# Patient Record
Sex: Female | Born: 1970 | Race: White | Hispanic: No | Marital: Single | Smoking: Current every day smoker
Health system: Southern US, Community
[De-identification: ages and names within clinical notes are randomized; demographics above are authoritative.]

## PROBLEM LIST (undated history)

## (undated) DIAGNOSIS — F909 Attention-deficit hyperactivity disorder, unspecified type: Secondary | ICD-10-CM

## (undated) DIAGNOSIS — F1910 Other psychoactive substance abuse, uncomplicated: Secondary | ICD-10-CM

## (undated) DIAGNOSIS — F329 Major depressive disorder, single episode, unspecified: Secondary | ICD-10-CM

## (undated) DIAGNOSIS — J449 Chronic obstructive pulmonary disease, unspecified: Secondary | ICD-10-CM

## (undated) DIAGNOSIS — F419 Anxiety disorder, unspecified: Secondary | ICD-10-CM

## (undated) DIAGNOSIS — J45909 Unspecified asthma, uncomplicated: Secondary | ICD-10-CM

## (undated) DIAGNOSIS — G629 Polyneuropathy, unspecified: Secondary | ICD-10-CM

## (undated) DIAGNOSIS — I2510 Atherosclerotic heart disease of native coronary artery without angina pectoris: Secondary | ICD-10-CM

## (undated) DIAGNOSIS — F319 Bipolar disorder, unspecified: Secondary | ICD-10-CM

## (undated) DIAGNOSIS — K219 Gastro-esophageal reflux disease without esophagitis: Secondary | ICD-10-CM

## (undated) DIAGNOSIS — F32A Depression, unspecified: Secondary | ICD-10-CM

## (undated) DIAGNOSIS — J189 Pneumonia, unspecified organism: Secondary | ICD-10-CM

## (undated) DIAGNOSIS — R569 Unspecified convulsions: Secondary | ICD-10-CM

## (undated) DIAGNOSIS — M1990 Unspecified osteoarthritis, unspecified site: Secondary | ICD-10-CM

## (undated) DIAGNOSIS — D649 Anemia, unspecified: Secondary | ICD-10-CM

## (undated) DIAGNOSIS — R519 Headache, unspecified: Secondary | ICD-10-CM

## (undated) DIAGNOSIS — N83209 Unspecified ovarian cyst, unspecified side: Secondary | ICD-10-CM

## (undated) DIAGNOSIS — G4730 Sleep apnea, unspecified: Secondary | ICD-10-CM

## (undated) DIAGNOSIS — I82409 Acute embolism and thrombosis of unspecified deep veins of unspecified lower extremity: Secondary | ICD-10-CM

## (undated) DIAGNOSIS — B1920 Unspecified viral hepatitis C without hepatic coma: Secondary | ICD-10-CM

## (undated) HISTORY — PX: WISDOM TOOTH EXTRACTION: SHX21

## (undated) HISTORY — PX: TUBAL LIGATION: SHX77

## (undated) HISTORY — PX: CHOLECYSTECTOMY: SHX55

## (undated) HISTORY — PX: TONSILLECTOMY: SUR1361

## (undated) HISTORY — PX: BACK SURGERY: SHX140

---

## 2015-10-26 ENCOUNTER — Encounter: Payer: Self-pay | Admitting: Emergency Medicine

## 2015-10-26 ENCOUNTER — Emergency Department: Payer: Medicaid Other

## 2015-10-26 ENCOUNTER — Emergency Department
Admission: EM | Admit: 2015-10-26 | Discharge: 2015-10-26 | Disposition: A | Discharge status: Home / Self Care | Payer: Medicaid Other | Attending: Emergency Medicine | Admitting: Emergency Medicine

## 2015-10-26 DIAGNOSIS — Z79899 Other long term (current) drug therapy: Secondary | ICD-10-CM | POA: Insufficient documentation

## 2015-10-26 DIAGNOSIS — S8261XA Displaced fracture of lateral malleolus of right fibula, initial encounter for closed fracture: Secondary | ICD-10-CM | POA: Insufficient documentation

## 2015-10-26 DIAGNOSIS — F329 Major depressive disorder, single episode, unspecified: Secondary | ICD-10-CM | POA: Insufficient documentation

## 2015-10-26 DIAGNOSIS — S99912A Unspecified injury of left ankle, initial encounter: Secondary | ICD-10-CM | POA: Diagnosis present

## 2015-10-26 DIAGNOSIS — X501XXA Overexertion from prolonged static or awkward postures, initial encounter: Secondary | ICD-10-CM | POA: Diagnosis not present

## 2015-10-26 DIAGNOSIS — S82891A Other fracture of right lower leg, initial encounter for closed fracture: Secondary | ICD-10-CM

## 2015-10-26 DIAGNOSIS — Y998 Other external cause status: Secondary | ICD-10-CM | POA: Insufficient documentation

## 2015-10-26 DIAGNOSIS — S82831A Other fracture of upper and lower end of right fibula, initial encounter for closed fracture: Secondary | ICD-10-CM | POA: Diagnosis not present

## 2015-10-26 DIAGNOSIS — Y9389 Activity, other specified: Secondary | ICD-10-CM | POA: Insufficient documentation

## 2015-10-26 DIAGNOSIS — Z720 Tobacco use: Secondary | ICD-10-CM | POA: Insufficient documentation

## 2015-10-26 DIAGNOSIS — Y9289 Other specified places as the place of occurrence of the external cause: Secondary | ICD-10-CM | POA: Diagnosis not present

## 2015-10-26 DIAGNOSIS — F419 Anxiety disorder, unspecified: Secondary | ICD-10-CM | POA: Diagnosis not present

## 2015-10-26 HISTORY — DX: Depression, unspecified: F32.A

## 2015-10-26 HISTORY — DX: Anxiety disorder, unspecified: F41.9

## 2015-10-26 HISTORY — DX: Unspecified viral hepatitis C without hepatic coma: B19.20

## 2015-10-26 HISTORY — DX: Major depressive disorder, single episode, unspecified: F32.9

## 2015-10-26 HISTORY — DX: Gastro-esophageal reflux disease without esophagitis: K21.9

## 2015-10-26 HISTORY — DX: Unspecified convulsions: R56.9

## 2015-10-26 MED ORDER — IBUPROFEN 800 MG PO TABS: 800.0000 mg | ORAL_TABLET | Freq: Three times a day (TID) | ORAL | Status: DC | PRN

## 2015-10-26 MED ORDER — HYDROMORPHONE HCL 1 MG/ML IJ SOLN
1.0000 mg | Freq: Once | INTRAMUSCULAR | Status: AC
  Administered 2015-10-26: 1 mg via INTRAMUSCULAR
  Filled 2015-10-26: qty 1

## 2015-10-26 MED ORDER — KETOROLAC TROMETHAMINE 60 MG/2ML IM SOLN
60.0000 mg | Freq: Once | INTRAMUSCULAR | Status: AC
Start: 2015-10-26 — End: 2015-10-26
  Administered 2015-10-26: 60 mg via INTRAMUSCULAR
  Filled 2015-10-26: qty 2

## 2015-10-26 MED ORDER — OXYCODONE-ACETAMINOPHEN 7.5-325 MG PO TABS: 1.0000 | ORAL_TABLET | ORAL | Status: DC | PRN

## 2015-10-26 NOTE — ED Provider Notes (Signed)
[facility] Emergency Department Provider Note  ____________________________________________  Time seen: Approximately 10:53 AM  I have reviewed the triage vital signs and the nursing notes.   HISTORY  Chief Complaint Ankle Pain    HPI Denise Valentine is a 44 y.o. female patient complaining of right ankle pain secondary to a twisting incident. Patient states she stepped off the porch and twisted her right ankle resulting in immediate pain and edema. Patient states she is unable to bear weight secondary to the pain. No palliative measures taken for this complaint.Patient is rating the pain as a 10 over 10.   Past Medical History  Diagnosis Date  . Depression   . Anxiety   . Seizures (HCC)   . GERD (gastroesophageal reflux disease)   . Hepatitis C     There are no active problems to display for this patient.   History reviewed. No pertinent past surgical history.  Current Outpatient Rx  Name  Route  Sig  Dispense  Refill  . FLUoxetine (PROZAC) 20 MG tablet   Oral   Take 20 mg by mouth daily.         Marland Kitchen gabapentin (NEURONTIN) 300 MG capsule   Oral   Take 300 mg by mouth 3 (three) times daily.         . pantoprazole (PROTONIX) 40 MG tablet   Oral   Take 40 mg by mouth daily.         Marland Kitchen spironolactone (ALDACTONE) 100 MG tablet   Oral   Take 100 mg by mouth daily.         . traZODone (DESYREL) 100 MG tablet   Oral   Take 100 mg by mouth at bedtime.         Marland Kitchen ibuprofen (ADVIL,MOTRIN) 800 MG tablet   Oral   Take 1 tablet (800 mg total) by mouth every 8 (eight) hours as needed for moderate pain.   15 tablet   0   . oxyCODONE-acetaminophen (PERCOCET) 7.5-325 MG tablet   Oral   Take 1 tablet by mouth every 4 (four) hours as needed for severe pain.   20 tablet   0     Allergies Review of patient's allergies indicates no known allergies.  History reviewed. No pertinent family history.  Social History Social History  Substance  Use Topics  . Smoking status: Current Every Day Smoker  . Smokeless tobacco: None  . Alcohol Use: Yes    Review of Systems Constitutional: No fever/chills. Eyes: No visual changes. ENT: No sore throat. Cardiovascular: Denies chest pain. Respiratory: Denies shortness of breath. Gastrointestinal: No abdominal pain.  No nausea, no vomiting.  No diarrhea.  No constipation. Genitourinary: Negative for dysuria. Musculoskeletal: Left ankle pain Skin: Negative for rash. Neurological: Negative for headaches, focal weakness or numbness. Psychiatric:Anxiety and depression Allergic/Immunilogical:  Hepatitis C 10-point ROS otherwise negative.  ____________________________________________   PHYSICAL EXAM:  VITAL SIGNS: ED Triage Vitals  Enc Vitals Group     BP 10/26/15 1020 138/52 mmHg     Pulse Rate 10/26/15 1020 81     Resp 10/26/15 1020 22     Temp 10/26/15 1020 98.1 F (36.7 C)     Temp Source 10/26/15 1020 Oral     SpO2 10/26/15 1020 100 %     Weight 10/26/15 1020 201 lb (91.173 kg)     Height 10/26/15 1020  (1.702 m)     Head Cir --      Peak Flow --  Pain Score 10/26/15 1017 10     Pain Loc --      Pain Edu? --      Excl. in GC? --     Constitutional: Alert and oriented. Moderate distress.  Eyes: Conjunctivae are normal. PERRL. EOMI. Head: Atraumatic. Nose: No congestion/rhinnorhea. Mouth/Throat: Mucous membranes are moist.  Oropharynx non-erythematous. Neck: No stridor.  No cervical spine tenderness to palpation. Hematological/Lymphatic/Immunilogical: No cervical lymphadenopathy. Cardiovascular: Normal rate, regular rhythm. Grossly normal heart sounds.  Good peripheral circulation. Respiratory: Normal respiratory effort.  No retractions. Lungs CTAB. Gastrointestinal: Soft and nontender. No distention. No abdominal bruits. No CVA tenderness. Musculoskeletal: Obvious edema to the right lateral malleolus Neurologic:  Normal speech and language. No gross focal  neurologic deficits are appreciated.Skin:  Skin is warm, dry and intact. No rash noted. Psychiatric: Mood and affect are normal. Speech and behavior are normal.  ____________________________________________   LABS (all labs ordered are listed, but only abnormal results are displayed)  Labs Reviewed - No data to display ____________________________________________  EKG   ____________________________________________  RADIOLOGY  Mild avulsion fracture lateral malleolus. I, Joni Reiningonald K Earnest Mcgillis, personally viewed and evaluated these images (plain radiographs) as part of my medical decision making.   ____________________________________________   PROCEDURES  Procedure(s) performed: None  Critical Care performed: No  ____________________________________________   INITIAL IMPRESSION / ASSESSMENT AND PLAN / ED COURSE  Pertinent labs & imaging results that were available during my care of the patient were reviewed by me and considered in my medical decision making (see chart for details).  Small avulsion fracture right lateral malleolus. Skull is x-ray findings with patient. Patient placed in a Velcro ankle splint, given crutches, and Percocets. Patient advised follow orthopedics by calling for an appointment. ____________________________________________   FINAL CLINICAL IMPRESSION(S) / ED DIAGNOSES  Final diagnoses:  Avulsion fracture of ankle, right, closed, initial encounter      Joni ReiningRonald K Talon Regala, PA-C 10/26/15 1158  Sharman CheekPhillip Stafford, MD 10/26/15 [phone]

## 2015-10-26 NOTE — ED Notes (Signed)
Splint applied to right ankle. Patient instructed on correct use of crutches, able to demonstrate her understanding.

## 2015-10-26 NOTE — ED Notes (Signed)
States she stepped off porch wrong and twisted right ankle   Positive swelling and unable to bear wt. Positive pulses and good circulation

## 2015-10-26 NOTE — Discharge Instructions (Signed)
Were splinted family with crutches until he evaluation by orthopedic Dr.

## 2015-10-26 NOTE — ED Notes (Signed)
Pt to ed with c/o right ankle pain after stepping off porcha t home today and twisting ankle.  Pt with noted swelling to right ankle. +pulse, movement and sensation in right foot.

## 2015-11-16 ENCOUNTER — Emergency Department: Payer: Medicaid Other

## 2015-11-16 ENCOUNTER — Encounter: Payer: Self-pay | Admitting: *Deleted

## 2015-11-16 ENCOUNTER — Emergency Department
Admission: EM | Admit: 2015-11-16 | Discharge: 2015-11-16 | Disposition: A | Discharge status: Home / Self Care | Payer: Medicaid Other | Attending: Emergency Medicine | Admitting: Emergency Medicine

## 2015-11-16 DIAGNOSIS — Z79899 Other long term (current) drug therapy: Secondary | ICD-10-CM | POA: Diagnosis not present

## 2015-11-16 DIAGNOSIS — R109 Unspecified abdominal pain: Secondary | ICD-10-CM | POA: Diagnosis present

## 2015-11-16 DIAGNOSIS — F17200 Nicotine dependence, unspecified, uncomplicated: Secondary | ICD-10-CM | POA: Diagnosis not present

## 2015-11-16 DIAGNOSIS — Z3202 Encounter for pregnancy test, result negative: Secondary | ICD-10-CM | POA: Insufficient documentation

## 2015-11-16 DIAGNOSIS — N12 Tubulo-interstitial nephritis, not specified as acute or chronic: Secondary | ICD-10-CM | POA: Diagnosis not present

## 2015-11-16 LAB — BASIC METABOLIC PANEL
ANION GAP: 6 (ref 5–15)
BUN: 7 mg/dL (ref 6–20)
CO2: 23 mmol/L (ref 22–32)
Calcium: 8 mg/dL — ABNORMAL LOW (ref 8.9–10.3)
Chloride: 103 mmol/L (ref 101–111)
Creatinine, Ser: 0.64 mg/dL (ref 0.44–1.00)
Glucose, Bld: 102 mg/dL — ABNORMAL HIGH (ref 65–99)
POTASSIUM: 3.9 mmol/L (ref 3.5–5.1)
SODIUM: 132 mmol/L — AB (ref 135–145)

## 2015-11-16 LAB — CBC
HCT: 32.6 % — ABNORMAL LOW (ref 35.0–47.0)
Hemoglobin: 10.1 g/dL — ABNORMAL LOW (ref 12.0–16.0)
MCH: 22.3 pg — ABNORMAL LOW (ref 26.0–34.0)
MCHC: 31.1 g/dL — ABNORMAL LOW (ref 32.0–36.0)
MCV: 71.9 fL — ABNORMAL LOW (ref 80.0–100.0)
Platelets: 315 K/uL (ref 150–440)
RBC: 4.53 MIL/uL (ref 3.80–5.20)
RDW: 16.6 % — ABNORMAL HIGH (ref 11.5–14.5)
WBC: 14.2 K/uL — ABNORMAL HIGH (ref 3.6–11.0)

## 2015-11-16 LAB — URINALYSIS COMPLETE WITH MICROSCOPIC (ARMC ONLY)
Bacteria, UA: NONE SEEN
Bilirubin Urine: NEGATIVE
Glucose, UA: NEGATIVE mg/dL
Hgb urine dipstick: NEGATIVE
Ketones, ur: NEGATIVE mg/dL
Leukocytes, UA: NEGATIVE
Nitrite: NEGATIVE
Protein, ur: NEGATIVE mg/dL
Specific Gravity, Urine: 1.019 (ref 1.005–1.030)
pH: 5 (ref 5.0–8.0)

## 2015-11-16 LAB — POCT PREGNANCY, URINE: Preg Test, Ur: NEGATIVE

## 2015-11-16 MED ORDER — ONDANSETRON HCL 4 MG PO TABS: 4.0000 mg | ORAL_TABLET | Freq: Every day | ORAL | Status: DC | PRN

## 2015-11-16 MED ORDER — DEXTROSE 5 % IV SOLN
1.0000 g | Freq: Once | INTRAVENOUS | Status: AC
  Administered 2015-11-16: 1 g via INTRAVENOUS
  Filled 2015-11-16 (×2): qty 10

## 2015-11-16 MED ORDER — CEPHALEXIN 500 MG PO CAPS: 500.0000 mg | ORAL_CAPSULE | Freq: Four times a day (QID) | ORAL | Status: AC

## 2015-11-16 MED ORDER — OXYCODONE-ACETAMINOPHEN 5-325 MG PO TABS
1.0000 | ORAL_TABLET | Freq: Once | ORAL | Status: AC
  Administered 2015-11-16: 1 via ORAL
  Filled 2015-11-16: qty 1

## 2015-11-16 MED ORDER — OXYCODONE-ACETAMINOPHEN 5-325 MG PO TABS: 1.0000 | ORAL_TABLET | Freq: Four times a day (QID) | ORAL | Status: DC | PRN

## 2015-11-16 NOTE — ED Provider Notes (Addendum)
[facility] Emergency Department Provider Note  ____________________________________________   I have reviewed the triage vital signs and the nursing notes.   HISTORY  Chief Complaint I have a UTI   HPI Denise Valentine is a 44 y.o. female with a history of recurrent urinary tract infections states that she has had a urinary tract infection she believes since yesterday. She has had flank pain and nausea but no vomiting low-grade fever but no actual fever, she denies any diarrhea or  constipation. The pain is gradual in onset. With dysuria and urinary frequency similar to prior urinary tract infections. Patient states she has no history of kidney stones, search of care anywhere shows no cultures available from us or any local hospitals like can find at this time  Past Medical History  Diagnosis Date  . Depression   . Anxiety   . Seizures (HCC)   . GERD (gastroesophageal reflux disease)   . Hepatitis C     There are no active problems to display for this patient.   History reviewed. No pertinent past surgical history.  Current Outpatient Rx  Name  Route  Sig  Dispense  Refill  . FLUoxetine (PROZAC) 20 MG tablet   Oral   Take 20 mg by mouth daily.         Marland Kitchen. gabapentin (NEURONTIN) 300 MG capsule   Oral   Take 300 mg by mouth 3 (three) times daily.         Marland Kitchen. ibuprofen (ADVIL,MOTRIN) 800 MG tablet   Oral   Take 1 tablet (800 mg total) by mouth every 8 (eight) hours as needed for moderate pain.   15 tablet   0   . oxyCODONE-acetaminophen (PERCOCET) 7.5-325 MG tablet   Oral   Take 1 tablet by mouth every 4 (four) hours as needed for severe pain.   20 tablet   0   . pantoprazole (PROTONIX) 40 MG tablet   Oral   Take 40 mg by mouth daily.         Marland Kitchen. spironolactone (ALDACTONE) 100 MG tablet   Oral   Take 100 mg by mouth daily.         . traZODone (DESYREL) 100 MG tablet   Oral   Take 100 mg by mouth at bedtime.            Allergies Review of patient's allergies indicates no known allergies.  No family history on file.  Social History Social History  Substance Use Topics  . Smoking status: Current Every Day Smoker  . Smokeless tobacco: None  . Alcohol Use: Yes    Review of Systems Constitutional: No fever/chills Eyes: No visual changes. ENT: No sore throat. No stiff neck no neck pain Cardiovascular: Denies chest pain. Respiratory: Denies shortness of breath. Gastrointestinal:   no vomiting.  No diarrhea.  No constipation. Genitourinary: Positive for dysuria. Musculoskeletal: Negative lower extremity swelling Skin: Negative for rash. Neurological: Negative for headaches, focal weakness or numbness. 10-point ROS otherwise negative.  ____________________________________________   PHYSICAL EXAM:  VITAL SIGNS: ED Triage Vitals  Enc Vitals Group     BP 11/16/15 1617 130/111 mmHg     Pulse Rate 11/16/15 1615 101     Resp 11/16/15 1615 24     Temp 11/16/15 1615 98.4 F (36.9 C)     Temp src --      SpO2 11/16/15 1615 100 %     Weight 11/16/15 1615 200 lb (90.719 kg)  Height 11/16/15 1615  (1.702 m)     Head Cir --      Peak Flow --      Pain Score 11/16/15 1615 8     Pain Loc --      Pain Edu? --      Excl. in GC? --     Constitutional: Alert and oriented. Well appearing and in no acute distress. Eyes: Conjunctivae are normal. PERRL. EOMI. Head: Atraumatic. Nose: No congestion/rhinnorhea. Mouth/Throat: Mucous membranes are moist.  Oropharynx non-erythematous. Neck: No stridor.   Nontender with no meningismus Cardiovascular: Normal rate, regular rhythm. Grossly normal heart sounds.  Good peripheral circulation. Respiratory: Normal respiratory effort.  No retractions. Lungs CTAB. Abdominal: Soft and nontender. No distention. No guarding no rebound Back: There is mild right-sided discomfort to the flank region Musculoskeletal: No lower extremity tenderness. No joint  effusions, no DVT signs strong distal pulses no edema Neurologic:  Normal speech and language. No gross focal neurologic deficits are appreciated.  Skin:  Skin is warm, dry and intact. No rash noted. Psychiatric: Mood and affect are somewhat anxious. Speech and behavior are normal.  ____________________________________________   LABS (all labs ordered are listed, but only abnormal results are displayed)  Labs Reviewed  CBC - Abnormal; Notable for the following:    WBC 14.2 (*)    Hemoglobin 10.1 (*)    HCT 32.6 (*)    MCV 71.9 (*)    MCH 22.3 (*)    MCHC 31.1 (*)    RDW 16.6 (*)    All other components within normal limits  BASIC METABOLIC PANEL - Abnormal; Notable for the following:    Sodium 132 (*)    Glucose, Bld 102 (*)    Calcium 8.0 (*)    All other components within normal limits  URINALYSIS COMPLETEWITH MICROSCOPIC ([facility] ONLY) - Abnormal; Notable for the following:    Color, Urine YELLOW (*)    APPearance CLEAR (*)    Squamous Epithelial / LPF 0-5 (*)    All other components within normal limits  URINE CULTURE  POC URINE PREG, ED  POCT PREGNANCY, URINE   ____________________________________________  EKG  I personally interpreted any EKGs ordered by me or triage  ____________________________________________  RADIOLOGY  I reviewed any imaging ordered by me or triage that were performed during my shift ____________________________________________   PROCEDURES  Procedure(s) performed: None  Critical Care performed: None  ____________________________________________   INITIAL IMPRESSION / ASSESSMENT AND PLAN / ED COURSE  Pertinent labs & imaging results that were available during my care of the patient were reviewed by me and considered in my medical decision making (see chart for details).  Patient presents today complaining of having a urinary tract infection. Her urine certainly isn't overwhelming she has a high white count flank pain and dysuria  urinary frequency and recurrent urinary tract infections most recently she states a month ago. These records are not available here. We will give her a dose of Rocephin while cultures are pending and I will discharge her on antibiotics. She is nonseptic in appearance. Patient is anxious and upset heart rate when I'm in the room her heart rate is in the mid 80s  ----------------------------------------- 7:41 PM on 11/16/2015 -----------------------------------------  Patient states that she feels much better. Vital signs are reassuring. No evidence of sepsis or overwhelming infection. Have given IV antibiotics as a precaution and we will have her go home. Patient eager to leave. Declines offered admission. Would prefer to follow  up as needed. We will give her Keflex as she states prior UTIs have been sensitive to that and I cannot get any culture results on care anywhere. The patient has no evidence of an infected kidney stone. She has been treated with IV antibiotics and will return as she feels worse.    ____________________________________________   FINAL CLINICAL IMPRESSION(S) / ED DIAGNOSES  Final diagnoses:  None     Jeanmarie Plant, MD 11/16/15 1739  Jeanmarie Plant, MD 11/16/15 [phone]

## 2015-11-16 NOTE — ED Notes (Signed)
Chills right low back pain, bladder pressure, some nausea

## 2015-11-16 NOTE — ED Notes (Signed)
Lab called for add on of urine culture  

## 2015-11-16 NOTE — ED Notes (Signed)
Pt reports right lower back pain.  Sx began yesterday.  Hx uti.  Pt states it feels like another kidney infection .  Pt has nausea.  No vomiting or diarrhea.  Iv in place.

## 2015-11-18 LAB — URINE CULTURE

## 2015-12-14 ENCOUNTER — Emergency Department
Admission: EM | Admit: 2015-12-14 | Discharge: 2015-12-14 | Disposition: A | Discharge status: Home / Self Care | Payer: Medicaid Other | Attending: Emergency Medicine | Admitting: Emergency Medicine

## 2015-12-14 DIAGNOSIS — N390 Urinary tract infection, site not specified: Secondary | ICD-10-CM | POA: Diagnosis not present

## 2015-12-14 DIAGNOSIS — R350 Frequency of micturition: Secondary | ICD-10-CM | POA: Diagnosis present

## 2015-12-14 DIAGNOSIS — Z79899 Other long term (current) drug therapy: Secondary | ICD-10-CM | POA: Diagnosis not present

## 2015-12-14 DIAGNOSIS — F17200 Nicotine dependence, unspecified, uncomplicated: Secondary | ICD-10-CM | POA: Insufficient documentation

## 2015-12-14 HISTORY — DX: Unspecified ovarian cyst, unspecified side: N83.209

## 2015-12-14 LAB — URINALYSIS COMPLETE WITH MICROSCOPIC (ARMC ONLY)
BACTERIA UA: NONE SEEN
BILIRUBIN URINE: NEGATIVE
GLUCOSE, UA: NEGATIVE mg/dL
HGB URINE DIPSTICK: NEGATIVE
LEUKOCYTES UA: NEGATIVE
NITRITE: NEGATIVE
Protein, ur: NEGATIVE mg/dL
RBC / HPF: NONE SEEN RBC/hpf (ref 0–5)
SPECIFIC GRAVITY, URINE: 1.026 (ref 1.005–1.030)
pH: 5 (ref 5.0–8.0)

## 2015-12-14 MED ORDER — CIPROFLOXACIN HCL 500 MG PO TABS: 500.0000 mg | ORAL_TABLET | Freq: Two times a day (BID) | ORAL | Status: DC

## 2015-12-14 MED ORDER — PHENAZOPYRIDINE HCL 95 MG PO TABS: 95.0000 mg | ORAL_TABLET | Freq: Three times a day (TID) | ORAL | Status: DC | PRN

## 2015-12-14 NOTE — Discharge Instructions (Signed)
Urinary Tract Infection °Urinary tract infections (UTIs) can develop anywhere along your urinary tract. Your urinary tract is your body's drainage system for removing wastes and extra water. Your urinary tract includes two kidneys, two ureters, a bladder, and a urethra. Your kidneys are a pair of bean-shaped organs. Each kidney is about the size of your fist. They are located below your ribs, one on each side of your spine. °CAUSES °Infections are caused by microbes, which are microscopic organisms, including fungi, viruses, and bacteria. These organisms are so small that they can only be seen through a microscope. Bacteria are the microbes that most commonly cause UTIs. °SYMPTOMS  °Symptoms of UTIs may vary by age and gender of the patient and by the location of the infection. Symptoms in young women typically include a frequent and intense urge to urinate and a painful, burning feeling in the bladder or urethra during urination. Older women and men are more likely to be tired, shaky, and weak and have muscle aches and abdominal pain. A fever may mean the infection is in your kidneys. Other symptoms of a kidney infection include pain in your back or sides below the ribs, nausea, and vomiting. °DIAGNOSIS °To diagnose a UTI, your caregiver will ask you about your symptoms. Your caregiver will also ask you to provide a urine sample. The urine sample will be tested for bacteria and white blood cells. White blood cells are made by your body to help fight infection. °TREATMENT  °Typically, UTIs can be treated with medication. Because most UTIs are caused by a bacterial infection, they usually can be treated with the use of antibiotics. The choice of antibiotic and length of treatment depend on your symptoms and the type of bacteria causing your infection. °HOME CARE INSTRUCTIONS °· If you were prescribed antibiotics, take them exactly as your caregiver instructs you. Finish the medication even if you feel better after  you have only taken some of the medication. °· Drink enough water and fluids to keep your urine clear or pale yellow. °· Avoid caffeine, tea, and carbonated beverages. They tend to irritate your bladder. °· Empty your bladder often. Avoid holding urine for long periods of time. °· Empty your bladder before and after sexual intercourse. °· After a bowel movement, women should cleanse from front to back. Use each tissue only once. °SEEK MEDICAL CARE IF:  °· You have back pain. °· You develop a fever. °· Your symptoms do not begin to resolve within 3 days. °SEEK IMMEDIATE MEDICAL CARE IF:  °· You have severe back pain or lower abdominal pain. °· You develop chills. °· You have nausea or vomiting. °· You have continued burning or discomfort with urination. °MAKE SURE YOU:  °· Understand these instructions. °· Will watch your condition. °· Will get help right away if you are not doing well or get worse. °  °This information is not intended to replace advice given to you by your health care provider. Make sure you discuss any questions you have with your health care provider. °  °Document Released: 09/13/2005 Document Revised: 08/25/2015 Document Reviewed: 01/12/2012 °Elsevier Interactive Patient Education ©2016 [employer] ° °Antibiotic Medicine °Antibiotic medicines are used to treat infections caused by bacteria. They work by injuring or killing the bacteria that is making you sick. °HOW IS AN ANTIBIOTIC CHOSEN? °An antibiotic is chosen based on many factors. To help your health care provider choose one for you, tell your health care provider if: °· You have any allergies. °·   You are pregnant or plan to get pregnant. °· You are breastfeeding. °· You are taking any medicines. These include over-the-counter medicines, prescription medicines, and herbal remedies. °· You have a medical condition or problem you have not already discussed. °Your health care provider will also consider: °· How often the medicine has to be  taken. °· Common side effects of the medicine. °· The cost of the medicine. °· The taste of the medicine. °If you have questions about why an antibiotic was chosen, make sure to ask. °FOR HOW LONG SHOULD I TAKE MY ANTIBIOTIC? °Continue to take your antibiotic for as long as told by your health care provider. Do not stop taking it when you feel better. If you stop taking it too soon: °· You may start to feel sick again. °· Your infection may become harder to treat. °· Complications may develop. °WHAT IF I MISS A DOSE? °Try not to miss any doses of medicine. If you miss a dose, take it as soon as possible. However, if it is almost time for the next dose: °· If you are taking 2 doses per day, take the missed dose and the next dose 5 to 6 hours apart. °· If you are taking 3 or more doses per day, take the missed dose and the next dose 2 to 4 hours apart, then go back to the normal schedule. °If you cannot make up a missed dose, take the next scheduled dose on time. Then take the missed dose after you have taken all the doses as recommended by your health care provider, as if you had one more dose left. °DO ANTIBIOTICS AFFECT BIRTH CONTROL? °Birth control pills may not work while you are on antibiotics. If you are taking birth control pills, continue taking them as usual and use a second form of birth control, such as a condom, to avoid unwanted pregnancy. Continue using the second form of birth control until you are finished with your current 1 month cycle of birth control pills. °OTHER INFORMATION °· If there is any medicine left over, throw it away. °· Never take someone else's antibiotics. °· Never take leftover antibiotics. °SEEK MEDICAL CARE IF: °· You get worse. °· You do not feel better within a few days of starting the antibiotic medicine. °· You vomit. °· White patches appear in your mouth. °· You have new joint pain that begins after starting the antibiotic. °· You have new muscle aches that begin after  starting the antibiotic. °· You had a fever before starting the antibiotic and it returns. °· You have any symptoms of an allergic reaction, such as an itchy rash. If this happens, stop taking the antibiotic. °SEEK IMMEDIATE MEDICAL CARE IF: °· Your urine turns dark or becomes blood-colored. °· Your skin turns yellow. °· You bruise or bleed easily. °· You have severe diarrhea and abdominal cramps. °· You have a severe headache. °· You have signs of a severe allergic reaction, such as: °¨ Trouble breathing. °¨ Wheezing. °¨ Swelling of the lips, tongue, or face. °¨ Fainting. °¨ Blisters on the skin or in the mouth. °If you have signs of a severe allergic reaction, stop taking the antibiotic right away. °  °This information is not intended to replace advice given to you by your health care provider. Make sure you discuss any questions you have with your health care provider. °  °Document Released: 08/16/2004 Document Revised: 08/25/2015 Document Reviewed: 04/21/2015 °Elsevier Interactive Patient Education ©2016 [employer] ° °

## 2015-12-14 NOTE — ED Notes (Signed)
Pt c/o right lower back pain with increased urinary frequency and pain for the past 2 days.. States she has had multiple UTI over the past 6 weeks

## 2015-12-14 NOTE — ED Provider Notes (Signed)
[facility] Emergency Department Provider Note  ____________________________________________  Time seen: Approximately 3:14 PM  I have reviewed the triage vital signs and the nursing notes.   HISTORY  Chief Complaint Urinary Frequency    HPI Analina Filla is a 44 y.o. female who presents to the emergency department with a history of multiple urinary tract infections over the last 6-8 weeks. Patient states that she has been seen here in her primary care for same. She states that she is having dark-colored urine, mild lower back pain, low-grade fever, dysuria, polyuria 2 days. Patient has been treated with penicillin based antibiotics primarily over the last several weeks. Patient has a referral to urology but has not followed up yet with same.   Past Medical History  Diagnosis Date  . Depression   . Anxiety   . Seizures (HCC)   . GERD (gastroesophageal reflux disease)   . Hepatitis C   . Ovarian cyst     There are no active problems to display for this patient.   Past Surgical History  Procedure Laterality Date  . Back surgery    . Tubal ligation    . Tonsillectomy    . Cholecystectomy    . Wisdom tooth extraction      Current Outpatient Rx  Name  Route  Sig  Dispense  Refill  . ciprofloxacin (CIPRO) 500 MG tablet   Oral   Take 1 tablet (500 mg total) by mouth 2 (two) times daily.   10 tablet   0   . FLUoxetine (PROZAC) 20 MG tablet   Oral   Take 20 mg by mouth daily.         Marland Kitchen gabapentin (NEURONTIN) 300 MG capsule   Oral   Take 300 mg by mouth 3 (three) times daily.         Marland Kitchen ibuprofen (ADVIL,MOTRIN) 800 MG tablet   Oral   Take 1 tablet (800 mg total) by mouth every 8 (eight) hours as needed for moderate pain.   15 tablet   0   . ondansetron (ZOFRAN) 4 MG tablet   Oral   Take 1 tablet (4 mg total) by mouth daily as needed for nausea or vomiting.   10 tablet   0   . oxyCODONE-acetaminophen (ROXICET) 5-325 MG tablet    Oral   Take 1 tablet by mouth every 6 (six) hours as needed.   8 tablet   0   . pantoprazole (PROTONIX) 40 MG tablet   Oral   Take 40 mg by mouth daily.         . phenazopyridine (PYRIDIUM) 95 MG tablet   Oral   Take 1 tablet (95 mg total) by mouth 3 (three) times daily as needed for pain.   6 tablet   0   . spironolactone (ALDACTONE) 100 MG tablet   Oral   Take 100 mg by mouth daily.         . traZODone (DESYREL) 100 MG tablet   Oral   Take 100 mg by mouth at bedtime.           Allergies Review of patient's allergies indicates no known allergies.  No family history on file.  Social History Social History  Substance Use Topics  . Smoking status: Current Every Day Smoker  . Smokeless tobacco: None  . Alcohol Use: Yes    Review of Systems Constitutional: No fever/chills Eyes: No visual changes. ENT: No sore throat. Cardiovascular: Denies chest pain. Respiratory: Denies  shortness of breath. Gastrointestinal: No abdominal pain.  No nausea, no vomiting.  No diarrhea.  No constipation. Genitourinary: Endorses dysuria, polyuria. Musculoskeletal: Negative for back pain. Skin: Negative for rash. Neurological: Negative for headaches, focal weakness or numbness.  10-point ROS otherwise negative.  ____________________________________________   PHYSICAL EXAM:  VITAL SIGNS: ED Triage Vitals  Enc Vitals Group     BP 12/14/15 1503 120/51 mmHg     Pulse Rate 12/14/15 1503 63     Resp 12/14/15 1503 18     Temp 12/14/15 1503 98.2 F (36.8 C)     Temp Source 12/14/15 1503 Oral     SpO2 12/14/15 1503 95 %     Weight 12/14/15 1503 215 lb (97.523 kg)     Height 12/14/15 1503 5\' 7"  (1.702 m)     Head Cir --      Peak Flow --      Pain Score 12/14/15 1504 6     Pain Loc --      Pain Edu? --      Excl. in GC? --     Constitutional: Alert and oriented. Well appearing and in no acute distress. Eyes: Conjunctivae are normal. PERRL. EOMI. Head:  Atraumatic. Nose: No congestion/rhinnorhea. Mouth/Throat: Mucous membranes are moist.  Oropharynx non-erythematous. Neck: No stridor.   Cardiovascular: Normal rate, regular rhythm. Grossly normal heart sounds.  Good peripheral circulation. Respiratory: Normal respiratory effort.  No retractions. Lungs CTAB. Gastrointestinal: Soft to palpation. Mildly tender to palpation in the suprapubic region. No distention. No guarding. No abdominal bruits. Mild right-sided CVA tenderness. Musculoskeletal: No lower extremity tenderness nor edema.  No joint effusions. Neurologic:  Normal speech and language. No gross focal neurologic deficits are appreciated. No gait instability. Skin:  Skin is warm, dry and intact. No rash noted. Psychiatric: Mood and affect are normal. Speech and behavior are normal.  ____________________________________________   LABS (all labs ordered are listed, but only abnormal results are displayed)  Labs Reviewed  URINALYSIS COMPLETEWITH MICROSCOPIC ([facility] ONLY) - Abnormal; Notable for the following:    Color, Urine AMBER (*)    APPearance CLEAR (*)    Ketones, ur TRACE (*)    Squamous Epithelial / LPF 0-5 (*)    All other components within normal limits  URINE CULTURE   ____________________________________________  EKG   ____________________________________________  RADIOLOGY  CT scan from 11/16/2015 is reviewed this time. ____________________________________________   PROCEDURES  Procedure(s) performed: None  Critical Care performed: No  ____________________________________________   INITIAL IMPRESSION / ASSESSMENT AND PLAN / ED COURSE  Pertinent labs & imaging results that were available during my care of the patient were reviewed by me and considered in my medical decision making (see chart for details).  Patient presents to the emergency department with symptoms that are consistent with urinary tract infection. Urinalysis does not reveal any  leukocytes or nitrites. Patient does not appear ill or meet any criteria for sepsis.  However based on patient's history of recurring urinary tract infections she will be treated for urinary tract infection at this time. Patient will be managed on an outpatient basis. Patient has received multiple doses of Keflex in the past for her urinary tract infections. I will place the patient on Cipro at this time. Urine culture is sent off and patient will be notified if results return with any resistance to Cipro. I discussed at length with patient and her husband the importance of following up with urology for further evaluation and treatment. Patient verbalizes understanding  of the importance to follow up with urology.     Discharge Medication List as of 12/14/2015  4:05 PM    START taking these medications   Details  ciprofloxacin (CIPRO) 500 MG tablet Take 1 tablet (500 mg total) by mouth 2 (two) times daily., Starting 12/14/2015, Until Discontinued, Print    phenazopyridine (PYRIDIUM) 95 MG tablet Take 1 tablet (95 mg total) by mouth 3 (three) times daily as needed for pain., Starting 12/14/2015, Until Discontinued, Print        ____________________________________________   FINAL CLINICAL IMPRESSION(S) / ED DIAGNOSES  Final diagnoses:  Urinary tract infection, recurrent      Racheal Patches, PA-C 12/14/15 1617  Loleta Rose, MD 12/14/15 2111

## 2015-12-14 NOTE — ED Notes (Signed)
Pt discharged home after verbalizing understanding of discharge instructions; nad noted. 

## 2015-12-14 NOTE — ED Notes (Signed)
Pt reports that she has had 4 kidney infections in last 6 weeks and feels like she has another one. States she has been seen by her pcp, who told her she needs to see urologist to possibly be put on prophylactic antibiotics but has not been given an appointment yet. Pt reports that she has back pain, fever, dark urine, pain upon urination, nausea since yesterday.

## 2015-12-17 LAB — URINE CULTURE: Special Requests: NORMAL

## 2015-12-27 ENCOUNTER — Encounter: Payer: Self-pay | Admitting: *Deleted

## 2015-12-27 ENCOUNTER — Other Ambulatory Visit: Payer: Self-pay

## 2015-12-27 ENCOUNTER — Emergency Department: Payer: MEDICAID

## 2015-12-27 ENCOUNTER — Emergency Department
Admission: EM | Admit: 2015-12-27 | Discharge: 2015-12-27 | Disposition: A | Discharge status: Home / Self Care | Payer: MEDICAID | Attending: Emergency Medicine | Admitting: Emergency Medicine

## 2015-12-27 DIAGNOSIS — F17200 Nicotine dependence, unspecified, uncomplicated: Secondary | ICD-10-CM | POA: Insufficient documentation

## 2015-12-27 DIAGNOSIS — R0602 Shortness of breath: Secondary | ICD-10-CM | POA: Diagnosis present

## 2015-12-27 DIAGNOSIS — Z3202 Encounter for pregnancy test, result negative: Secondary | ICD-10-CM | POA: Diagnosis not present

## 2015-12-27 DIAGNOSIS — J159 Unspecified bacterial pneumonia: Secondary | ICD-10-CM | POA: Insufficient documentation

## 2015-12-27 DIAGNOSIS — F410 Panic disorder [episodic paroxysmal anxiety] without agoraphobia: Secondary | ICD-10-CM | POA: Insufficient documentation

## 2015-12-27 DIAGNOSIS — D649 Anemia, unspecified: Secondary | ICD-10-CM

## 2015-12-27 DIAGNOSIS — J441 Chronic obstructive pulmonary disease with (acute) exacerbation: Secondary | ICD-10-CM | POA: Diagnosis not present

## 2015-12-27 DIAGNOSIS — D509 Iron deficiency anemia, unspecified: Secondary | ICD-10-CM | POA: Insufficient documentation

## 2015-12-27 DIAGNOSIS — F419 Anxiety disorder, unspecified: Secondary | ICD-10-CM

## 2015-12-27 DIAGNOSIS — J189 Pneumonia, unspecified organism: Secondary | ICD-10-CM

## 2015-12-27 HISTORY — DX: Unspecified asthma, uncomplicated: J45.909

## 2015-12-27 HISTORY — DX: Chronic obstructive pulmonary disease, unspecified: J44.9

## 2015-12-27 LAB — COMPREHENSIVE METABOLIC PANEL
ALT: 11 U/L — ABNORMAL LOW (ref 14–54)
ANION GAP: 4 — AB (ref 5–15)
AST: 20 U/L (ref 15–41)
Albumin: 3.1 g/dL — ABNORMAL LOW (ref 3.5–5.0)
Alkaline Phosphatase: 64 U/L (ref 38–126)
CO2: 25 mmol/L (ref 22–32)
Calcium: 8.2 mg/dL — ABNORMAL LOW (ref 8.9–10.3)
Chloride: 105 mmol/L (ref 101–111)
Creatinine, Ser: 0.6 mg/dL (ref 0.44–1.00)
Glucose, Bld: 148 mg/dL — ABNORMAL HIGH (ref 65–99)
POTASSIUM: 3.5 mmol/L (ref 3.5–5.1)
Sodium: 134 mmol/L — ABNORMAL LOW (ref 135–145)
Total Bilirubin: 0.3 mg/dL (ref 0.3–1.2)
Total Protein: 6.6 g/dL (ref 6.5–8.1)

## 2015-12-27 LAB — BASIC METABOLIC PANEL
ANION GAP: 5 (ref 5–15)
BUN: <5 mg/dL — ABNORMAL LOW (ref 6–20)
CALCIUM: 8.4 mg/dL — AB (ref 8.9–10.3)
CO2: 25 mmol/L (ref 22–32)
Chloride: 106 mmol/L (ref 101–111)
Creatinine, Ser: 0.65 mg/dL (ref 0.44–1.00)
GLUCOSE: 134 mg/dL — AB (ref 65–99)
POTASSIUM: 3.3 mmol/L — AB (ref 3.5–5.1)
SODIUM: 136 mmol/L (ref 135–145)

## 2015-12-27 LAB — CBC WITH DIFFERENTIAL/PLATELET
Basophils Absolute: 0.1 10*3/uL (ref 0–0.1)
Basophils Relative: 1 %
EOS ABS: 0.2 10*3/uL (ref 0–0.7)
EOS PCT: 1 %
HCT: 32 % — ABNORMAL LOW (ref 35.0–47.0)
Hemoglobin: 9.9 g/dL — ABNORMAL LOW (ref 12.0–16.0)
LYMPHS ABS: 2.2 10*3/uL (ref 1.0–3.6)
LYMPHS PCT: 17 %
MCH: 21.4 pg — AB (ref 26.0–34.0)
MCHC: 30.9 g/dL — AB (ref 32.0–36.0)
MCV: 69.3 fL — AB (ref 80.0–100.0)
MONO ABS: 1.1 10*3/uL — AB (ref 0.2–0.9)
Monocytes Relative: 8 %
NEUTROS PCT: 73 %
Neutro Abs: 9.6 10*3/uL — ABNORMAL HIGH (ref 1.4–6.5)
PLATELETS: 337 10*3/uL (ref 150–440)
RBC: 4.62 MIL/uL (ref 3.80–5.20)
RDW: 16.6 % — AB (ref 11.5–14.5)
WBC: 13.1 10*3/uL — AB (ref 3.6–11.0)

## 2015-12-27 LAB — BRAIN NATRIURETIC PEPTIDE: B NATRIURETIC PEPTIDE 5: 150 pg/mL — AB (ref 0.0–100.0)

## 2015-12-27 LAB — POCT PREGNANCY, URINE: PREG TEST UR: NEGATIVE

## 2015-12-27 LAB — TROPONIN I: TROPONIN I: 0.03 ng/mL (ref <0.031)

## 2015-12-27 MED ORDER — FERROUS SULFATE DRIED ER 160 (50 FE) MG PO TBCR: 160.0000 mg | EXTENDED_RELEASE_TABLET | Freq: Every day | ORAL | Status: DC

## 2015-12-27 MED ORDER — LEVOFLOXACIN 750 MG PO TABS
750.0000 mg | ORAL_TABLET | Freq: Once | ORAL | Status: AC
  Administered 2015-12-27: 750 mg via ORAL
  Filled 2015-12-27: qty 1

## 2015-12-27 MED ORDER — IOHEXOL 350 MG/ML SOLN
75.0000 mL | Freq: Once | INTRAVENOUS | Status: AC | PRN
  Administered 2015-12-27: 75 mL via INTRAVENOUS

## 2015-12-27 MED ORDER — IPRATROPIUM-ALBUTEROL 0.5-2.5 (3) MG/3ML IN SOLN
3.0000 mL | Freq: Once | RESPIRATORY_TRACT | Status: AC
  Administered 2015-12-27: 3 mL via RESPIRATORY_TRACT

## 2015-12-27 MED ORDER — IOHEXOL 350 MG/ML SOLN: 100.0000 mL | Freq: Once | INTRAVENOUS | Status: DC | PRN

## 2015-12-27 MED ORDER — DIAZEPAM 5 MG/ML IJ SOLN
5.0000 mg | Freq: Once | INTRAMUSCULAR | Status: AC
  Administered 2015-12-27: 5 mg via INTRAVENOUS
  Filled 2015-12-27: qty 2

## 2015-12-27 MED ORDER — LEVOFLOXACIN 750 MG PO TABS: 750.0000 mg | ORAL_TABLET | Freq: Every day | ORAL | Status: DC

## 2015-12-27 MED ORDER — DIAZEPAM 5 MG/ML IJ SOLN
5.0000 mg | Freq: Once | INTRAMUSCULAR | Status: AC
  Administered 2015-12-27: 5 mg via INTRAVENOUS

## 2015-12-27 MED ORDER — DIAZEPAM 5 MG/ML IJ SOLN
INTRAMUSCULAR | Status: AC
  Administered 2015-12-27: 5 mg via INTRAVENOUS
  Filled 2015-12-27: qty 2

## 2015-12-27 MED ORDER — DIAZEPAM 5 MG PO TABS: 5.0000 mg | ORAL_TABLET | Freq: Three times a day (TID) | ORAL | Status: DC | PRN

## 2015-12-27 MED ORDER — ALBUTEROL SULFATE (2.5 MG/3ML) 0.083% IN NEBU
5.0000 mg | INHALATION_SOLUTION | Freq: Once | RESPIRATORY_TRACT | Status: AC
  Administered 2015-12-27: 5 mg via RESPIRATORY_TRACT
  Filled 2015-12-27: qty 6

## 2015-12-27 MED ORDER — IPRATROPIUM-ALBUTEROL 0.5-2.5 (3) MG/3ML IN SOLN
RESPIRATORY_TRACT | Status: AC
  Administered 2015-12-27: 3 mL via RESPIRATORY_TRACT
  Filled 2015-12-27: qty 3

## 2015-12-27 NOTE — ED Notes (Signed)
Per patient's report, Patient has c/o cough for two days and became short of breath today. Patient c/o right side chest pain that radiates to her back and down her side. Patient appears uncomfortable in triage.

## 2015-12-27 NOTE — Discharge Instructions (Signed)
Panic Attacks °Panic attacks are sudden, short-lived surges of severe anxiety, fear, or discomfort. They may occur for no reason when you are relaxed, when you are anxious, or when you are sleeping. Panic attacks may occur for a number of reasons:  °· Healthy people occasionally have panic attacks in extreme, life-threatening situations, such as war or natural disasters. Normal anxiety is a protective mechanism of the body that helps us react to danger (fight or flight response). °· Panic attacks are often seen with anxiety disorders, such as panic disorder, social anxiety disorder, generalized anxiety disorder, and phobias. Anxiety disorders cause excessive or uncontrollable anxiety. They may interfere with your relationships or other life activities. °· Panic attacks are sometimes seen with other mental illnesses, such as depression and posttraumatic stress disorder. °· Certain medical conditions, prescription medicines, and drugs of abuse can cause panic attacks. °SYMPTOMS  °Panic attacks start suddenly, peak within 20 minutes, and are accompanied by four or more of the following symptoms: °· Pounding heart or fast heart rate (palpitations). °· Sweating. °· Trembling or shaking. °· Shortness of breath or feeling smothered. °· Feeling choked. °· Chest pain or discomfort. °· Nausea or strange feeling in your stomach. °· Dizziness, light-headedness, or feeling like you will faint. °· Chills or hot flushes. °· Numbness or tingling in your lips or hands and feet. °· Feeling that things are not real or feeling that you are not yourself. °· Fear of losing control or going crazy. °· Fear of dying. °Some of these symptoms can mimic serious medical conditions. For example, you may think you are having a heart attack. Although panic attacks can be very scary, they are not life threatening. °DIAGNOSIS  °Panic attacks are diagnosed through an assessment by your health care provider. Your health care provider will ask  questions about your symptoms, such as where and when they occurred. Your health care provider will also ask about your medical history and use of alcohol and drugs, including prescription medicines. Your health care provider may order blood tests or other studies to rule out a serious medical condition. Your health care provider may refer you to a mental health professional for further evaluation. °TREATMENT  °· Most healthy people who have one or two panic attacks in an extreme, life-threatening situation will not require treatment. °· The treatment for panic attacks associated with anxiety disorders or other mental illness typically involves counseling with a mental health professional, medicine, or a combination of both. Your health care provider will help determine what treatment is best for you. °· Panic attacks due to physical illness usually go away with treatment of the illness. If prescription medicine is causing panic attacks, talk with your health care provider about stopping the medicine, decreasing the dose, or substituting another medicine. °· Panic attacks due to alcohol or drug abuse go away with abstinence. Some adults need professional help in order to stop drinking or using drugs. °HOME CARE INSTRUCTIONS  °· Take all medicines as directed by your health care provider.   °· Schedule and attend follow-up visits as directed by your health care provider. It is important to keep all your appointments. °SEEK MEDICAL CARE IF: °· You are not able to take your medicines as prescribed. °· Your symptoms do not improve or get worse. °SEEK IMMEDIATE MEDICAL CARE IF:  °· You experience panic attack symptoms that are different than your usual symptoms. °· You have serious thoughts about hurting yourself or others. °· You are taking medicine for panic attacks and   have a serious side effect. MAKE SURE YOU:  Understand these instructions.  Will watch your condition.  Will get help right away if you are not  doing well or get worse.   This information is not intended to replace advice given to you by your health care provider. Make sure you discuss any questions you have with your health care provider.   Document Released: 12/04/2005 Document Revised: 12/09/2013 Document Reviewed: 07/18/2013 Elsevier Interactive Patient Education 2016 [employer] Iron Deficiency Anemia, Adult Anemia is a condition in which there are less red blood cells or hemoglobin in the blood than normal. Hemoglobin is the part of red blood cells that carries oxygen. Iron deficiency anemia is anemia caused by too little iron. It is the most common type of anemia. It may leave you tired and short of breath. CAUSES   Lack of iron in the diet.  Poor absorption of iron, as seen with intestinal disorders.  Intestinal bleeding.  Heavy periods. SIGNS AND SYMPTOMS  Mild anemia may not be noticeable. Symptoms may include:  Fatigue.  Headache.  Pale skin.  Weakness.  Tiredness.  Shortness of breath.  Dizziness.  Cold hands and feet.  Fast or irregular heartbeat. DIAGNOSIS  Diagnosis requires a thorough evaluation and physical exam by your health care provider. Blood tests are generally used to confirm iron deficiency anemia. Additional tests may be done to find the underlying cause of your anemia. These may include:  Testing for blood in the stool (fecal occult blood test).  A procedure to see inside the colon and rectum (colonoscopy).  A procedure to see inside the esophagus and stomach (endoscopy). TREATMENT  Iron deficiency anemia is treated by correcting the cause of the deficiency. Treatment may involve:  Adding iron-rich foods to your diet.  Taking iron supplements. Pregnant or breastfeeding women need to take extra iron because their normal diet usually does not provide the required amount.  Taking vitamins. Vitamin C improves the absorption of iron. Your health care provider may recommend that you  take your iron tablets with a glass of orange juice or vitamin C supplement.  Medicines to make heavy menstrual flow lighter.  Surgery. HOME CARE INSTRUCTIONS   Take iron as directed by your health care provider.  If you cannot tolerate taking iron supplements by mouth, talk to your health care provider about taking them through a vein (intravenously) or an injection into a muscle.  For the best iron absorption, iron supplements should be taken on an empty stomach. If you cannot tolerate them on an empty stomach, you may need to take them with food.  Do not drink milk or take antacids at the same time as your iron supplements. Milk and antacids may interfere with the absorption of iron.  Iron supplements can cause constipation. Make sure to include fiber in your diet to prevent constipation. A stool softener may also be recommended.  Take vitamins as directed by your health care provider.  Eat a diet rich in iron. Foods high in iron include liver, lean beef, whole-grain bread, eggs, dried fruit, and dark green leafy vegetables. SEEK IMMEDIATE MEDICAL CARE IF:   You faint. If this happens, do not drive. Call your local emergency services (911 in [place] if no other help is available.  You have chest pain.  You feel nauseous or vomit.  You have severe or increased shortness of breath with activity.  You feel weak.  You have a rapid heartbeat.  You have unexplained sweating.  You become light-headed when getting up from a chair or bed. MAKE SURE YOU:   Understand these instructions.  Will watch your condition.  Will get help right away if you are not doing well or get worse.   This information is not intended to replace advice given to you by your health care provider. Make sure you discuss any questions you have with your health care provider.   Document Released: 12/01/2000 Document Revised: 12/25/2014 Document Reviewed: 08/11/2013 Elsevier Interactive Patient Education  [employer]

## 2015-12-27 NOTE — ED Provider Notes (Addendum)
[facility] Emergency Department Provider Note     Time seen: ----------------------------------------- 1:09 PM on 12/27/2015 -----------------------------------------    I have reviewed the triage vital signs and the nursing notes.   HISTORY  Chief Complaint Shortness of Breath    HPI Denise Valentine is a 45 y.o. female presents to ER with cough for 2 days and shortness of breath acutely today. Patient did complain some right-sided chest pain and raised her back down her side, she is brought in hyperventilating. Denies fevers, chills, nausea vomiting or diarrhea. Patient states she does use albuterol at home.   Past Medical History  Diagnosis Date  . Depression   . Anxiety   . Seizures (HCC)   . GERD (gastroesophageal reflux disease)   . Hepatitis C   . Ovarian cyst   . Asthma   . COPD (chronic obstructive pulmonary disease) (HCC)     There are no active problems to display for this patient.   Past Surgical History  Procedure Laterality Date  . Back surgery    . Tubal ligation    . Tonsillectomy    . Cholecystectomy    . Wisdom tooth extraction      Allergies Review of patient's allergies indicates no known allergies.  Social History Social History  Substance Use Topics  . Smoking status: Current Every Day Smoker  . Smokeless tobacco: None  . Alcohol Use: Yes    Review of Systems Constitutional: Negative for fever. Eyes: Negative for visual changes. ENT: Negative for sore throat. Cardiovascular: Negative for chest pain. Respiratory: Positive for shortness of breath and cough Gastrointestinal: Negative for abdominal pain, vomiting and diarrhea. Genitourinary: Negative for dysuria. Musculoskeletal: Negative for back pain. Skin: Negative for rash. Neurological: Negative for headaches, focal weakness or numbness.  10-point ROS otherwise negative.  ____________________________________________   PHYSICAL EXAM:  VITAL  SIGNS: ED Triage Vitals  Enc Vitals Group     BP 12/27/15 1122 138/58 mmHg     Pulse Rate 12/27/15 1122 89     Resp 12/27/15 1122 20     Temp 12/27/15 1122 98.5 F (36.9 C)     Temp Source 12/27/15 1122 Oral     SpO2 12/27/15 1122 98 %     Weight 12/27/15 1122 207 lb (93.895 kg)     Height 12/27/15 1122 5\' 7"  (1.702 m)     Head Cir --      Peak Flow --      Pain Score 12/27/15 1123 10     Pain Loc --      Pain Edu? --      Excl. in GC? --     Constitutional: Alert and oriented. Tearful and hyperventilating Eyes: Conjunctivae are normal. PERRL. Normal extraocular movements. ENT   Head: Normocephalic and atraumatic.   Nose: No congestion/rhinnorhea.   Mouth/Throat: Mucous membranes are moist.   Neck: No stridor. Cardiovascular: Normal rate, regular rhythm. Normal and symmetric distal pulses are present in all extremities. No murmurs, rubs, or gallops. Respiratory: Tachypnea with minimal wheezing bilaterally. Gastrointestinal: Soft and nontender. No distention. No abdominal bruits.  Musculoskeletal: Nontender with normal range of motion in all extremities. No joint effusions.  No lower extremity tenderness nor edema. Neurologic:  Normal speech and language. No gross focal neurologic deficits are appreciated. Speech is normal. No gait instability. Skin:  Skin is warm, dry and intact. No rash noted. Psychiatric: Mood and affect are normal. Speech and behavior are normal. Patient exhibits appropriate insight and judgment. ____________________________________________  EKG: Interpreted by me. Normal sinus rhythm with occasional PVCs, rate is 89 bpm, normal PR interval, normal QRS, normal QT.  ____________________________________________  ED COURSE:  Pertinent labs & imaging results that were available during my care of the patient were reviewed by me and considered in my medical decision making (see chart for details). Patient having acute anxiety attack. We'll check  basic labs and x-ray, give IV Valium. ____________________________________________    LABS (pertinent positives/negatives)  Labs Reviewed  CBC WITH DIFFERENTIAL/PLATELET - Abnormal; Notable for the following:    WBC 13.1 (*)    Hemoglobin 9.9 (*)    HCT 32.0 (*)    MCV 69.3 (*)    MCH 21.4 (*)    MCHC 30.9 (*)    RDW 16.6 (*)    Neutro Abs 9.6 (*)    Monocytes Absolute 1.1 (*)    All other components within normal limits  BRAIN NATRIURETIC PEPTIDE - Abnormal; Notable for the following:    B Natriuretic Peptide 150.0 (*)    All other components within normal limits  COMPREHENSIVE METABOLIC PANEL - Abnormal; Notable for the following:    Sodium 134 (*)    Glucose, Bld 148 (*)    BUN <5 (*)    Calcium 8.2 (*)    Albumin 3.1 (*)    ALT 11 (*)    Anion gap 4 (*)    All other components within normal limits  BASIC METABOLIC PANEL - Abnormal; Notable for the following:    Potassium 3.3 (*)    Glucose, Bld 134 (*)    BUN <5 (*)    Calcium 8.4 (*)    All other components within normal limits  TROPONIN I  POCT PREGNANCY, URINE  POC URINE PREG, ED    RADIOLOGY Images were viewed by me  Chest x-ray FINDINGS: The heart size and mediastinal contours are within normal limits. Lung volumes are relatively low. There is some prominence of pulmonary vascularity and interstitium bilaterally. Part of this appearance is felt to be on the basis of low lung volumes. Mild interstitial edema is not excluded. No focal airspace consolidation, pleural fluid or pneumothorax identified. The visualized skeletal structures are unremarkable.  IMPRESSION: Low lung volumes with prominence of pulmonary vascularity and interstitium bilaterally. This may be largely secondary to low volumes. Mild interstitial edema is not excluded.  IMPRESSION: 1. No pulmonary embolus or aortic dissection identified. 2. Advanced for age centrilobular and paraseptal emphysema. 3. Scattered ground-glass  opacities in both lungs, most confluent in the upper lobes, with mild mediastinal, right hilar, and bilateral infrahilar adenopathy as well as nodularity along the major fissures. This could represent atypical pneumonia with reactive adenopathy or sarcoidosis ; given the 9 by 8 by 6 mm nodule in the right upper lobe, malignancy is not completely excluded. If the patient is at high risk for bronchogenic carcinoma, follow-up chest CT at 3-6 months is recommended. If the patient is at low risk for bronchogenic carcinoma, follow-up chest CT at 6-12 months is recommended. This recommendation follows the consensus statement: Guidelines for Management of Small Pulmonary Nodules Detected on CT Scans: A Statement from the Fleischner Society as published in Radiology 2005; 237:395-400. ____________________________________________  FINAL ASSESSMENT AND PLAN  Panic attack, iron deficiency anemia, possible community acquired pneumonia  Plan: Patient with labs and imaging as dictated above. Patient presented hyperventilating with acute anxiety attack. She is also noted to be anemic. I will place her on slow iron as well as provide anxiolytic medication.  Ct findings are nonspecific and I will cover her with antibiotics for community acquired pneumonia. She is stable for outpatient follow-up with her doctor   Emily FilbertWilliams, Susana Duell E, MD   Emily FilbertJonathan E Thornton Dohrmann, MD 12/27/15 1454  Emily FilbertJonathan E Simmie Garin, MD 12/27/15 1537

## 2016-01-02 ENCOUNTER — Other Ambulatory Visit: Payer: Self-pay

## 2016-01-02 ENCOUNTER — Encounter: Payer: Self-pay | Admitting: Emergency Medicine

## 2016-01-02 ENCOUNTER — Emergency Department: Payer: Medicaid Other

## 2016-01-02 ENCOUNTER — Emergency Department
Admission: EM | Admit: 2016-01-02 | Discharge: 2016-01-03 | Disposition: A | Discharge status: Home / Self Care | Payer: Medicaid Other | Attending: Emergency Medicine | Admitting: Emergency Medicine

## 2016-01-02 DIAGNOSIS — J441 Chronic obstructive pulmonary disease with (acute) exacerbation: Secondary | ICD-10-CM | POA: Diagnosis not present

## 2016-01-02 DIAGNOSIS — F17210 Nicotine dependence, cigarettes, uncomplicated: Secondary | ICD-10-CM | POA: Diagnosis not present

## 2016-01-02 DIAGNOSIS — R2243 Localized swelling, mass and lump, lower limb, bilateral: Secondary | ICD-10-CM | POA: Diagnosis not present

## 2016-01-02 DIAGNOSIS — Z79899 Other long term (current) drug therapy: Secondary | ICD-10-CM | POA: Insufficient documentation

## 2016-01-02 DIAGNOSIS — M7989 Other specified soft tissue disorders: Secondary | ICD-10-CM

## 2016-01-02 DIAGNOSIS — Z792 Long term (current) use of antibiotics: Secondary | ICD-10-CM | POA: Insufficient documentation

## 2016-01-02 DIAGNOSIS — F329 Major depressive disorder, single episode, unspecified: Secondary | ICD-10-CM | POA: Insufficient documentation

## 2016-01-02 DIAGNOSIS — R0602 Shortness of breath: Secondary | ICD-10-CM | POA: Diagnosis present

## 2016-01-02 DIAGNOSIS — R0600 Dyspnea, unspecified: Secondary | ICD-10-CM

## 2016-01-02 LAB — CBC
HEMATOCRIT: 30.7 % — AB (ref 35.0–47.0)
HEMOGLOBIN: 9.5 g/dL — AB (ref 12.0–16.0)
MCH: 22 pg — AB (ref 26.0–34.0)
MCHC: 30.9 g/dL — AB (ref 32.0–36.0)
MCV: 71 fL — ABNORMAL LOW (ref 80.0–100.0)
Platelets: 304 10*3/uL (ref 150–440)
RBC: 4.32 MIL/uL (ref 3.80–5.20)
RDW: 17.4 % — ABNORMAL HIGH (ref 11.5–14.5)
WBC: 8.8 10*3/uL (ref 3.6–11.0)

## 2016-01-02 LAB — HEPATIC FUNCTION PANEL
ALBUMIN: 3.3 g/dL — AB (ref 3.5–5.0)
ALT: 14 U/L (ref 14–54)
AST: 22 U/L (ref 15–41)
Alkaline Phosphatase: 69 U/L (ref 38–126)
Bilirubin, Direct: <0.1 mg/dL — ABNORMAL LOW (ref 0.1–0.5)
TOTAL PROTEIN: 6.9 g/dL (ref 6.5–8.1)
Total Bilirubin: 0.4 mg/dL (ref 0.3–1.2)

## 2016-01-02 LAB — BRAIN NATRIURETIC PEPTIDE: B Natriuretic Peptide: 68 pg/mL (ref 0.0–100.0)

## 2016-01-02 LAB — BLOOD GAS, ARTERIAL
Acid-Base Excess: 1.3 mmol/L (ref 0.0–3.0)
Bicarbonate: 25 mEq/L (ref 21.0–28.0)
FIO2: 0.21
O2 SAT: 95.4 %
PATIENT TEMPERATURE: 37
PCO2 ART: 36 mmHg (ref 32.0–48.0)
PO2 ART: 74 mmHg — AB (ref 83.0–108.0)
pH, Arterial: 7.45 (ref 7.350–7.450)

## 2016-01-02 LAB — BASIC METABOLIC PANEL
ANION GAP: 6 (ref 5–15)
BUN: 6 mg/dL (ref 6–20)
CO2: 25 mmol/L (ref 22–32)
Calcium: 8.4 mg/dL — ABNORMAL LOW (ref 8.9–10.3)
Chloride: 105 mmol/L (ref 101–111)
Creatinine, Ser: 0.64 mg/dL (ref 0.44–1.00)
GFR calc Af Amer: >60 mL/min (ref >60)
GFR calc non Af Amer: >60 mL/min (ref >60)
GLUCOSE: 142 mg/dL — AB (ref 65–99)
POTASSIUM: 4 mmol/L (ref 3.5–5.1)
Sodium: 136 mmol/L (ref 135–145)

## 2016-01-02 LAB — TROPONIN I: Troponin I: <0.03 ng/mL (ref <0.031)

## 2016-01-02 NOTE — ED Notes (Addendum)
Pt seen her 12/27/15 and diagnosed with pneumonia; was not admitted; discharged with antibiotics which she has finished; not feeling any better; c/o shortness of breath and right outer chest pain; 1+ pitting edema to bilateral lower extremeties

## 2016-01-03 LAB — URINALYSIS COMPLETE WITH MICROSCOPIC (ARMC ONLY)
BACTERIA UA: NONE SEEN
Bilirubin Urine: NEGATIVE
Glucose, UA: NEGATIVE mg/dL
Hgb urine dipstick: NEGATIVE
KETONES UR: NEGATIVE mg/dL
Leukocytes, UA: NEGATIVE
NITRITE: NEGATIVE
PH: 6 (ref 5.0–8.0)
PROTEIN: NEGATIVE mg/dL
SPECIFIC GRAVITY, URINE: 1.008 (ref 1.005–1.030)

## 2016-01-03 MED ORDER — IPRATROPIUM-ALBUTEROL 0.5-2.5 (3) MG/3ML IN SOLN
3.0000 mL | Freq: Once | RESPIRATORY_TRACT | Status: AC
  Administered 2016-01-03: 3 mL via RESPIRATORY_TRACT

## 2016-01-03 MED ORDER — FUROSEMIDE 20 MG PO TABS: 20.0000 mg | ORAL_TABLET | Freq: Every day | ORAL | Status: DC

## 2016-01-03 MED ORDER — FUROSEMIDE 40 MG PO TABS: 20.0000 mg | ORAL_TABLET | Freq: Once | ORAL | Status: DC

## 2016-01-03 MED ORDER — FUROSEMIDE 10 MG/ML IJ SOLN
20.0000 mg | Freq: Once | INTRAMUSCULAR | Status: AC
  Administered 2016-01-03: 20 mg via INTRAVENOUS
  Filled 2016-01-03: qty 4

## 2016-01-03 NOTE — ED Notes (Signed)
Spoke with Dr Scotty CourtStafford and Dr Darnelle CatalanMalinda.  Pt's blood pressure, per husband is usually on the low side.  Pt sleepy, but husband states that she normally wakes up at 5am and is in bed by 9pm.  Pt's O2 100% while awake.

## 2016-01-03 NOTE — ED Notes (Signed)
Pt discharged to home.  Family member driving.  Discharge instructions reviewed.  Verbalized understanding.  No questions or concerns at this time.  Teach back verified.  Pt in NAD.  No items left in ED.   

## 2016-01-03 NOTE — ED Notes (Signed)
Spoke with Dr Darnelle CatalanMalinda regarding blood pressure, approved giving lasix at this time.

## 2016-01-03 NOTE — ED Provider Notes (Addendum)
[facility] Emergency Department Provider Note  ____________________________________________  Time seen: Approximately 1:07 AM  I have reviewed the triage vital signs and the nursing notes.   HISTORY  Chief Complaint Leg Swelling; Shortness of Breath; and Chest Pain    HPI Denise Valentine is a 44 y.o. female patient reports she was seen here on the ninth. She was put on antibiotic for possible community-acquired pneumonia. She finished that. she's really not feeling better. She is still feeling tired disorganized and confused. Her chest hurts especially on the right side when she breathes. Both legs are swollen. She feels short of breath. She had a CT of the chest CT angiogram chest on the ninth which showed the pneumonia with no pulmonary emboli. She and her husband do not want another CT of the chest. Her BNP has dropped from 150 to 68. Chest x-ray shows possible mild congestive failure. She has multiple pulmonary nodules and nodes. She appears to be very depressed.  Past Medical History  Diagnosis Date  . Depression   . Anxiety   . Seizures (HCC)   . GERD (gastroesophageal reflux disease)   . Hepatitis C   . Ovarian cyst   . Asthma   . COPD (chronic obstructive pulmonary disease) (HCC)     There are no active problems to display for this patient.   Past Surgical History  Procedure Laterality Date  . Back surgery    . Tubal ligation    . Tonsillectomy    . Cholecystectomy    . Wisdom tooth extraction      Current Outpatient Rx  Name  Route  Sig  Dispense  Refill  . buprenorphine-naloxone (SUBOXONE) 8-2 MG SUBL SL tablet   Sublingual   Place 1 tablet under the tongue 3 (three) times daily.         . ciprofloxacin (CIPRO) 500 MG tablet   Oral   Take 1 tablet (500 mg total) by mouth 2 (two) times daily.   10 tablet   0   . diazepam (VALIUM) 5 MG tablet   Oral   Take 1 tablet (5 mg total) by mouth every 8 (eight) hours as needed for  anxiety.   20 tablet   0   . ferrous sulfate (EQL SLOW RELEASE IRON) 160 (50 Fe) MG TBCR SR tablet   Oral   Take 1 tablet (160 mg total) by mouth daily.   30 each   6   . FLUoxetine (PROZAC) 20 MG tablet   Oral   Take 20 mg by mouth daily.         . furosemide (LASIX) 20 MG tablet   Oral   Take 1 tablet (20 mg total) by mouth daily.   30 tablet   11   . gabapentin (NEURONTIN) 300 MG capsule   Oral   Take 300 mg by mouth 3 (three) times daily.         Marland Kitchen ibuprofen (ADVIL,MOTRIN) 800 MG tablet   Oral   Take 1 tablet (800 mg total) by mouth every 8 (eight) hours as needed for moderate pain.   15 tablet   0   . levofloxacin (LEVAQUIN) 750 MG tablet   Oral   Take 1 tablet (750 mg total) by mouth daily.   5 tablet   0   . ondansetron (ZOFRAN) 4 MG tablet   Oral   Take 1 tablet (4 mg total) by mouth daily as needed for nausea or vomiting.   10  tablet   0   . oxyCODONE-acetaminophen (ROXICET) 5-325 MG tablet   Oral   Take 1 tablet by mouth every 6 (six) hours as needed.   8 tablet   0   . pantoprazole (PROTONIX) 40 MG tablet   Oral   Take 40 mg by mouth daily.         . phenazopyridine (PYRIDIUM) 95 MG tablet   Oral   Take 1 tablet (95 mg total) by mouth 3 (three) times daily as needed for pain.   6 tablet   0   . spironolactone (ALDACTONE) 100 MG tablet   Oral   Take 100 mg by mouth daily.         . traZODone (DESYREL) 100 MG tablet   Oral   Take 100 mg by mouth at bedtime.           Allergies Review of patient's allergies indicates no known allergies.  History reviewed. No pertinent family history.  Social History Social History  Substance Use Topics  . Smoking status: Current Every Day Smoker -- 5.00 packs/day    Types: Cigarettes  . Smokeless tobacco: None  . Alcohol Use: No    Review of Systems Constitutional: No fever/chills Eyes: No visual changes. ENT: No sore throat. Cardiovascular: See history of present  illness Respiratory: See history of present illness. Gastrointestinal: No abdominal pain.  No nausea, no vomiting.  No diarrhea.  No constipation. Genitourinary: Negative for dysuria. Musculoskeletal: Negative for back pain. Skin: Negative for rash. Neurological: Negative for headaches, focal weakness or numbness.  10-point ROS otherwise negative.  ____________________________________________   PHYSICAL EXAM:  VITAL SIGNS: ED Triage Vitals  Enc Vitals Group     BP 01/02/16 1934 113/62 mmHg     Pulse Rate 01/02/16 1934 94     Resp 01/02/16 1934 24     Temp 01/02/16 1934 98.2 F (36.8 C)     Temp Source 01/02/16 1934 Oral     SpO2 01/02/16 1934 95 %     Weight 01/02/16 1934 215 lb (97.523 kg)     Height 01/02/16 1934 5\' 7"  (1.702 m)     Head Cir --      Peak Flow --      Pain Score 01/02/16 1935 7     Pain Loc --      Pain Edu? --      Excl. in GC? --     Constitutional: Alert and oriented. Looks tired and depressed Eyes: Conjunctivae are normal. PERRL. EOMI. Head: Atraumatic. Nose: No congestion/rhinnorhea. Mouth/Throat: Mucous membranes are moist.  Oropharynx non-erythematous. Neck: No stridor.   Cardiovascular: Normal rate, regular rhythm. Grossly normal heart sounds.  Good peripheral circulation. Respiratory: Normal respiratory effort.  No retractions. Lungs CTAB. Some tenderness on palpation of the right side of the chest Gastrointestinal: Soft and nontender. No distention. No abdominal bruits. No CVA tenderness. Musculoskeletal: Bilateral edema to the knees.  No joint effusions. Neurologic:  Normal speech and language. No gross focal neurologic deficits are appreciated.  Skin:  Skin is warm, dry and intact. No rash noted.  ____________________________________________   LABS (all labs ordered are listed, but only abnormal results are displayed)  Labs Reviewed  BASIC METABOLIC PANEL - Abnormal; Notable for the following:    Glucose, Bld 142 (*)    Calcium 8.4  (*)    All other components within normal limits  CBC - Abnormal; Notable for the following:    Hemoglobin 9.5 (*)  HCT 30.7 (*)    MCV 71.0 (*)    MCH 22.0 (*)    MCHC 30.9 (*)    RDW 17.4 (*)    All other components within normal limits  BLOOD GAS, ARTERIAL - Abnormal; Notable for the following:    pO2, Arterial 74 (*)    All other components within normal limits  HEPATIC FUNCTION PANEL - Abnormal; Notable for the following:    Albumin 3.3 (*)    Bilirubin, Direct <0.1 (*)    All other components within normal limits  TROPONIN I  BRAIN NATRIURETIC PEPTIDE  URINALYSIS COMPLETEWITH MICROSCOPIC ([facility] ONLY)   ____________________________________________  EKG  EKG read and interpreted by me shows normal sinus rhythm rate of 73 normal axis and normal T waves essentially normal EKG. ____________________________________________  RADIOLOGY  Chest x-ray is read by radiology as possible mild congestive heart failure ____________________________________________   PROCEDURES    ____________________________________________   INITIAL IMPRESSION / ASSESSMENT AND PLAN / ED COURSE  Pertinent labs & imaging results that were available during my care of the patient were reviewed by me and considered in my medical decision making (see chart for details).  I am going to give the patient some Lasix to see if that helps with her subjective shortness of breath and the edema. Dr. Scotty CourtStafford will reevaluate her I expect that she will be allowed to be discharged home ____________________________________________   FINAL CLINICAL IMPRESSION(S) / ED DIAGNOSES  Final diagnoses:  Dyspnea  Leg swelling      Arnaldo NatalPaul F Kim Lauver, MD 01/03/16 0113  I spoke with the family and explained that the chest x-ray shows some small amount of fluid in the lungs and lab work looks okay with blood gas shows she is getting enough oxygen and it does not appear that I can keep her in the hospital. I will try  to give her some Lasix and see if that makes the swelling better and her breathing somewhat better and [facility] and try and get seen again in the office. I explained to them that because of the Epic system they can review all of the lab work that we've done as well as x-ray reports without any great difficulty. I also told them they're free to come back any time if they feel sicker again.  Arnaldo NatalPaul F Makaila Windle, MD 01/03/16 [id number]  Arnaldo NatalPaul F Anadia Helmes, MD 01/03/16 [phone]

## 2016-01-03 NOTE — Discharge Instructions (Signed)
Edema Edema is an abnormal buildup of fluids. It is more common in your legs and thighs. Painless swelling of the feet and ankles is more likely as a person ages. It also is common in looser skin, like around your eyes. HOME CARE   Keep the affected body part above the level of the heart while lying down.  Do not sit still or stand for a long time.  Do not put anything right under your knees when you lie down.  Do not wear tight clothes on your upper legs.  Exercise your legs to help the puffiness (swelling) go down.  Wear elastic bandages or support stockings as told by your doctor.  A low-salt diet may help lessen the puffiness.  Only take medicine as told by your doctor. GET HELP IF:  Treatment is not working.  You have heart, liver, or kidney disease and notice that your skin looks puffy or shiny.  You have puffiness in your legs that does not get better when you raise your legs.  You have sudden weight gain for no reason. GET HELP RIGHT AWAY IF:   You have shortness of breath or chest pain.  You cannot breathe when you lie down.  You have pain, redness, or warmth in the areas that are puffy.  You have heart, liver, or kidney disease and get edema all of a sudden.  You have a fever and your symptoms get worse all of a sudden. MAKE SURE YOU:   Understand these instructions.  Will watch your condition.  Will get help right away if you are not doing well or get worse.   This information is not intended to replace advice given to you by your health care provider. Make sure you discuss any questions you have with your health care provider.   Document Released: 05/22/2008 Document Revised: 12/09/2013 Document Reviewed: 09/26/2013 Elsevier Interactive Patient Education [employer]2016 [employer]  Please take the Lasix low dose one a day. Please see her doctor tomorrow or the next day. Please return for increasing swelling shortness of breath fever or chest tightness or any  other complaint.

## 2016-02-27 ENCOUNTER — Emergency Department: Payer: Medicaid Other

## 2016-02-27 ENCOUNTER — Inpatient Hospital Stay
Admission: EM | Admit: 2016-02-27 | Discharge: 2016-03-02 | DRG: 190 | Disposition: A | Discharge status: Home / Self Care | Payer: Medicaid Other | Attending: Internal Medicine | Admitting: Internal Medicine

## 2016-02-27 ENCOUNTER — Encounter: Payer: Self-pay | Admitting: *Deleted

## 2016-02-27 DIAGNOSIS — Z79899 Other long term (current) drug therapy: Secondary | ICD-10-CM

## 2016-02-27 DIAGNOSIS — F329 Major depressive disorder, single episode, unspecified: Secondary | ICD-10-CM | POA: Diagnosis present

## 2016-02-27 DIAGNOSIS — J209 Acute bronchitis, unspecified: Secondary | ICD-10-CM | POA: Diagnosis present

## 2016-02-27 DIAGNOSIS — J9601 Acute respiratory failure with hypoxia: Secondary | ICD-10-CM | POA: Diagnosis present

## 2016-02-27 DIAGNOSIS — R0682 Tachypnea, not elsewhere classified: Secondary | ICD-10-CM | POA: Diagnosis present

## 2016-02-27 DIAGNOSIS — Z9851 Tubal ligation status: Secondary | ICD-10-CM | POA: Diagnosis not present

## 2016-02-27 DIAGNOSIS — J101 Influenza due to other identified influenza virus with other respiratory manifestations: Secondary | ICD-10-CM | POA: Diagnosis present

## 2016-02-27 DIAGNOSIS — F17210 Nicotine dependence, cigarettes, uncomplicated: Secondary | ICD-10-CM | POA: Diagnosis present

## 2016-02-27 DIAGNOSIS — Z9049 Acquired absence of other specified parts of digestive tract: Secondary | ICD-10-CM | POA: Diagnosis not present

## 2016-02-27 DIAGNOSIS — Z9889 Other specified postprocedural states: Secondary | ICD-10-CM | POA: Diagnosis not present

## 2016-02-27 DIAGNOSIS — J45909 Unspecified asthma, uncomplicated: Secondary | ICD-10-CM | POA: Diagnosis present

## 2016-02-27 DIAGNOSIS — J9621 Acute and chronic respiratory failure with hypoxia: Secondary | ICD-10-CM | POA: Diagnosis present

## 2016-02-27 DIAGNOSIS — J441 Chronic obstructive pulmonary disease with (acute) exacerbation: Secondary | ICD-10-CM

## 2016-02-27 DIAGNOSIS — B1920 Unspecified viral hepatitis C without hepatic coma: Secondary | ICD-10-CM | POA: Diagnosis present

## 2016-02-27 DIAGNOSIS — J440 Chronic obstructive pulmonary disease with acute lower respiratory infection: Principal | ICD-10-CM | POA: Diagnosis present

## 2016-02-27 DIAGNOSIS — Z7951 Long term (current) use of inhaled steroids: Secondary | ICD-10-CM | POA: Diagnosis not present

## 2016-02-27 DIAGNOSIS — Z8249 Family history of ischemic heart disease and other diseases of the circulatory system: Secondary | ICD-10-CM | POA: Diagnosis not present

## 2016-02-27 DIAGNOSIS — G8929 Other chronic pain: Secondary | ICD-10-CM | POA: Diagnosis present

## 2016-02-27 DIAGNOSIS — K219 Gastro-esophageal reflux disease without esophagitis: Secondary | ICD-10-CM | POA: Diagnosis present

## 2016-02-27 DIAGNOSIS — R000 Tachycardia, unspecified: Secondary | ICD-10-CM | POA: Diagnosis present

## 2016-02-27 LAB — RAPID INFLUENZA A&B ANTIGENS (ARMC ONLY)
INFLUENZA A (ARMC): NEGATIVE
INFLUENZA B (ARMC): NEGATIVE

## 2016-02-27 LAB — CBC WITH DIFFERENTIAL/PLATELET
BASOS ABS: 0 10*3/uL (ref 0–0.1)
BASOS PCT: 1 %
EOS ABS: 0.1 10*3/uL (ref 0–0.7)
Eosinophils Relative: 1 %
HCT: 37.8 % (ref 35.0–47.0)
Hemoglobin: 12.2 g/dL (ref 12.0–16.0)
LYMPHS PCT: 22 %
Lymphs Abs: 1.4 10*3/uL (ref 1.0–3.6)
MCH: 23.9 pg — ABNORMAL LOW (ref 26.0–34.0)
MCHC: 32.3 g/dL (ref 32.0–36.0)
MCV: 73.8 fL — AB (ref 80.0–100.0)
Monocytes Absolute: 0.8 10*3/uL (ref 0.2–0.9)
Monocytes Relative: 13 %
Neutro Abs: 3.9 10*3/uL (ref 1.4–6.5)
Neutrophils Relative %: 63 %
PLATELETS: 238 10*3/uL (ref 150–440)
RBC: 5.13 MIL/uL (ref 3.80–5.20)
RDW: 20.5 % — AB (ref 11.5–14.5)
WBC: 6.1 10*3/uL (ref 3.6–11.0)

## 2016-02-27 LAB — BASIC METABOLIC PANEL
ANION GAP: 13 (ref 5–15)
BUN: 7 mg/dL (ref 6–20)
CO2: 21 mmol/L — AB (ref 22–32)
Calcium: 8.8 mg/dL — ABNORMAL LOW (ref 8.9–10.3)
Chloride: 100 mmol/L — ABNORMAL LOW (ref 101–111)
Creatinine, Ser: 0.71 mg/dL (ref 0.44–1.00)
GFR calc non Af Amer: >60 mL/min (ref >60)
GLUCOSE: 111 mg/dL — AB (ref 65–99)
POTASSIUM: 4 mmol/L (ref 3.5–5.1)
SODIUM: 134 mmol/L — AB (ref 135–145)

## 2016-02-27 LAB — PROCALCITONIN

## 2016-02-27 LAB — TSH: TSH: 5.004 u[IU]/mL — ABNORMAL HIGH (ref 0.350–4.500)

## 2016-02-27 LAB — HEMOGLOBIN A1C: Hgb A1c MFr Bld: 5.3 % (ref 4.0–6.0)

## 2016-02-27 MED ORDER — IPRATROPIUM-ALBUTEROL 0.5-2.5 (3) MG/3ML IN SOLN
RESPIRATORY_TRACT | Status: AC
  Administered 2016-02-27: 3 mL via RESPIRATORY_TRACT
  Filled 2016-02-27: qty 9

## 2016-02-27 MED ORDER — IPRATROPIUM-ALBUTEROL 0.5-2.5 (3) MG/3ML IN SOLN
3.0000 mL | Freq: Once | RESPIRATORY_TRACT | Status: AC
  Administered 2016-02-27: 3 mL via RESPIRATORY_TRACT

## 2016-02-27 MED ORDER — NICOTINE 21 MG/24HR TD PT24
21.0000 mg | MEDICATED_PATCH | Freq: Every day | TRANSDERMAL | Status: DC
  Administered 2016-02-27 – 2016-03-02 (×5): 21 mg via TRANSDERMAL
  Filled 2016-02-27 (×5): qty 1

## 2016-02-27 MED ORDER — DIPHENHYDRAMINE HCL 50 MG/ML IJ SOLN
25.0000 mg | Freq: Once | INTRAMUSCULAR | Status: AC
  Administered 2016-02-27: 25 mg via INTRAVENOUS

## 2016-02-27 MED ORDER — BUPRENORPHINE HCL 8 MG SL SUBL
8.0000 mg | SUBLINGUAL_TABLET | Freq: Three times a day (TID) | SUBLINGUAL | Status: DC
  Administered 2016-02-27 – 2016-03-02 (×13): 8 mg via SUBLINGUAL
  Filled 2016-02-27 (×13): qty 1

## 2016-02-27 MED ORDER — FLUOXETINE HCL 40 MG PO CAPS: 40.0000 mg | ORAL_CAPSULE | Freq: Every day | ORAL | Status: DC

## 2016-02-27 MED ORDER — TRAZODONE HCL 100 MG PO TABS
100.0000 mg | ORAL_TABLET | Freq: Every day | ORAL | Status: DC
  Administered 2016-02-27 – 2016-03-01 (×4): 100 mg via ORAL
  Filled 2016-02-27 (×4): qty 1

## 2016-02-27 MED ORDER — PREDNISONE 20 MG PO TABS: 20.0000 mg | ORAL_TABLET | Freq: Every day | ORAL | Status: DC

## 2016-02-27 MED ORDER — ALBUTEROL SULFATE HFA 108 (90 BASE) MCG/ACT IN AERS: 2.0000 | INHALATION_SPRAY | Freq: Four times a day (QID) | RESPIRATORY_TRACT | Status: DC | PRN

## 2016-02-27 MED ORDER — METHYLPREDNISOLONE SODIUM SUCC 125 MG IJ SOLR
125.0000 mg | Freq: Once | INTRAMUSCULAR | Status: AC
  Administered 2016-02-27: 125 mg via INTRAVENOUS
  Filled 2016-02-27: qty 2

## 2016-02-27 MED ORDER — ALBUTEROL SULFATE (2.5 MG/3ML) 0.083% IN NEBU
3.0000 mL | INHALATION_SOLUTION | RESPIRATORY_TRACT | Status: DC
  Administered 2016-02-27 (×2): 3 mL via RESPIRATORY_TRACT
  Filled 2016-02-27 (×2): qty 3

## 2016-02-27 MED ORDER — IPRATROPIUM-ALBUTEROL 0.5-2.5 (3) MG/3ML IN SOLN
3.0000 mL | Freq: Once | RESPIRATORY_TRACT | Status: AC
  Administered 2016-02-27: 3 mL via RESPIRATORY_TRACT
  Filled 2016-02-27: qty 3

## 2016-02-27 MED ORDER — AZITHROMYCIN 250 MG PO TABS: ORAL_TABLET | ORAL | Status: DC

## 2016-02-27 MED ORDER — PREDNISONE 10 MG PO TABS: 5.0000 mg | ORAL_TABLET | Freq: Every day | ORAL | Status: DC

## 2016-02-27 MED ORDER — DEXTROSE 5 % IV SOLN
500.0000 mg | Freq: Once | INTRAVENOUS | Status: AC
  Administered 2016-02-27: 500 mg via INTRAVENOUS
  Filled 2016-02-27: qty 500

## 2016-02-27 MED ORDER — GUAIFENESIN ER 600 MG PO TB12
600.0000 mg | ORAL_TABLET | Freq: Two times a day (BID) | ORAL | Status: DC
Start: 2016-02-27 — End: 2016-03-02
  Administered 2016-02-27 – 2016-03-02 (×9): 600 mg via ORAL
  Filled 2016-02-27 (×9): qty 1

## 2016-02-27 MED ORDER — SPIRONOLACTONE 25 MG PO TABS
25.0000 mg | ORAL_TABLET | Freq: Every day | ORAL | Status: DC
  Administered 2016-02-27 – 2016-03-02 (×5): 25 mg via ORAL
  Filled 2016-02-27 (×5): qty 1

## 2016-02-27 MED ORDER — ACETAMINOPHEN 325 MG PO TABS
650.0000 mg | ORAL_TABLET | Freq: Four times a day (QID) | ORAL | Status: DC | PRN
  Administered 2016-02-27 – 2016-03-01 (×6): 650 mg via ORAL
  Filled 2016-02-27 (×6): qty 2

## 2016-02-27 MED ORDER — PREDNISONE 20 MG PO TABS: 50.0000 mg | ORAL_TABLET | Freq: Every day | ORAL | Status: DC

## 2016-02-27 MED ORDER — ENOXAPARIN SODIUM 40 MG/0.4ML ~~LOC~~ SOLN
40.0000 mg | SUBCUTANEOUS | Status: DC
  Administered 2016-02-27 – 2016-03-02 (×5): 40 mg via SUBCUTANEOUS
  Filled 2016-02-27 (×5): qty 0.4

## 2016-02-27 MED ORDER — IPRATROPIUM-ALBUTEROL 0.5-2.5 (3) MG/3ML IN SOLN
RESPIRATORY_TRACT | Status: AC
  Administered 2016-02-27: 3 mL via RESPIRATORY_TRACT
  Filled 2016-02-27: qty 3

## 2016-02-27 MED ORDER — FUROSEMIDE 20 MG PO TABS
20.0000 mg | ORAL_TABLET | Freq: Every day | ORAL | Status: DC
  Administered 2016-02-27 – 2016-03-02 (×5): 20 mg via ORAL
  Filled 2016-02-27 (×5): qty 1

## 2016-02-27 MED ORDER — GABAPENTIN 300 MG PO CAPS
300.0000 mg | ORAL_CAPSULE | Freq: Three times a day (TID) | ORAL | Status: DC
  Administered 2016-02-27 – 2016-03-02 (×13): 300 mg via ORAL
  Filled 2016-02-27 (×13): qty 1

## 2016-02-27 MED ORDER — LEVOFLOXACIN IN D5W 500 MG/100ML IV SOLN
500.0000 mg | Freq: Once | INTRAVENOUS | Status: AC
  Administered 2016-02-27: 11:00:00 500 mg via INTRAVENOUS
  Filled 2016-02-27: qty 100

## 2016-02-27 MED ORDER — PREDNISONE 20 MG PO TABS: 60.0000 mg | ORAL_TABLET | Freq: Every day | ORAL | Status: DC

## 2016-02-27 MED ORDER — PREDNISONE 20 MG PO TABS
ORAL_TABLET | ORAL | Status: AC
  Filled 2016-02-27: qty 3

## 2016-02-27 MED ORDER — PREDNISONE 10 MG PO TABS: 10.0000 mg | ORAL_TABLET | Freq: Every day | ORAL | Status: DC

## 2016-02-27 MED ORDER — MOMETASONE FURO-FORMOTEROL FUM 200-5 MCG/ACT IN AERO
2.0000 | INHALATION_SPRAY | Freq: Two times a day (BID) | RESPIRATORY_TRACT | Status: DC
  Filled 2016-02-27 (×2): qty 8.8

## 2016-02-27 MED ORDER — LEVOFLOXACIN IN D5W 500 MG/100ML IV SOLN
500.0000 mg | INTRAVENOUS | Status: DC
  Administered 2016-02-28: 09:00:00 500 mg via INTRAVENOUS
  Filled 2016-02-27: qty 100

## 2016-02-27 MED ORDER — GUAIFENESIN-CODEINE 100-10 MG/5ML PO SOLN
10.0000 mL | Freq: Four times a day (QID) | ORAL | Status: DC | PRN
  Administered 2016-02-27 – 2016-02-28 (×3): 10 mL via ORAL
  Filled 2016-02-27 (×3): qty 10

## 2016-02-27 MED ORDER — ONDANSETRON HCL 4 MG PO TABS: 4.0000 mg | ORAL_TABLET | Freq: Four times a day (QID) | ORAL | Status: DC | PRN

## 2016-02-27 MED ORDER — METHYLPREDNISOLONE SODIUM SUCC 125 MG IJ SOLR
60.0000 mg | Freq: Four times a day (QID) | INTRAMUSCULAR | Status: DC
  Administered 2016-02-27 – 2016-03-01 (×12): 60 mg via INTRAVENOUS
  Filled 2016-02-27 (×12): qty 2

## 2016-02-27 MED ORDER — ONDANSETRON HCL 4 MG/2ML IJ SOLN: 4.0000 mg | Freq: Four times a day (QID) | INTRAMUSCULAR | Status: DC | PRN

## 2016-02-27 MED ORDER — ACETAMINOPHEN 650 MG RE SUPP: 650.0000 mg | Freq: Four times a day (QID) | RECTAL | Status: DC | PRN

## 2016-02-27 MED ORDER — AZITHROMYCIN 250 MG PO TABS: 250.0000 mg | ORAL_TABLET | Freq: Every day | ORAL | Status: DC

## 2016-02-27 MED ORDER — LEVOFLOXACIN IN D5W 500 MG/100ML IV SOLN
500.0000 mg | INTRAVENOUS | Status: DC
Start: 2016-02-27 — End: 2016-02-27

## 2016-02-27 MED ORDER — DOCUSATE SODIUM 100 MG PO CAPS
100.0000 mg | ORAL_CAPSULE | Freq: Two times a day (BID) | ORAL | Status: DC
  Administered 2016-02-27 – 2016-03-02 (×9): 100 mg via ORAL
  Filled 2016-02-27 (×9): qty 1

## 2016-02-27 MED ORDER — PREDNISONE 20 MG PO TABS: 60.0000 mg | ORAL_TABLET | Freq: Once | ORAL | Status: DC

## 2016-02-27 MED ORDER — IBUPROFEN 400 MG PO TABS
800.0000 mg | ORAL_TABLET | Freq: Three times a day (TID) | ORAL | Status: DC | PRN
  Administered 2016-03-01: 02:00:00 800 mg via ORAL
  Filled 2016-02-27: qty 2

## 2016-02-27 MED ORDER — PREDNISONE 20 MG PO TABS: 40.0000 mg | ORAL_TABLET | Freq: Every day | ORAL | Status: DC

## 2016-02-27 MED ORDER — SODIUM CHLORIDE 0.9% FLUSH
3.0000 mL | Freq: Two times a day (BID) | INTRAVENOUS | Status: DC
  Administered 2016-02-28 – 2016-03-02 (×8): 3 mL via INTRAVENOUS

## 2016-02-27 MED ORDER — PREDNISONE 20 MG PO TABS: 30.0000 mg | ORAL_TABLET | Freq: Every day | ORAL | Status: DC

## 2016-02-27 MED ORDER — CETYLPYRIDINIUM CHLORIDE 0.05 % MT LIQD
7.0000 mL | Freq: Two times a day (BID) | OROMUCOSAL | Status: DC
  Administered 2016-02-29 – 2016-03-02 (×3): 7 mL via OROMUCOSAL

## 2016-02-27 MED ORDER — IPRATROPIUM-ALBUTEROL 0.5-2.5 (3) MG/3ML IN SOLN
3.0000 mL | Freq: Four times a day (QID) | RESPIRATORY_TRACT | Status: DC
  Administered 2016-02-27 – 2016-03-02 (×15): 3 mL via RESPIRATORY_TRACT
  Filled 2016-02-27 (×17): qty 3

## 2016-02-27 MED ORDER — PANTOPRAZOLE SODIUM 40 MG PO TBEC
40.0000 mg | DELAYED_RELEASE_TABLET | Freq: Every day | ORAL | Status: DC
  Administered 2016-02-27 – 2016-03-02 (×5): 40 mg via ORAL
  Filled 2016-02-27 (×5): qty 1

## 2016-02-27 MED ORDER — BUDESONIDE-FORMOTEROL FUMARATE 160-4.5 MCG/ACT IN AERO
2.0000 | INHALATION_SPRAY | Freq: Two times a day (BID) | RESPIRATORY_TRACT | Status: DC
  Administered 2016-02-27 – 2016-03-02 (×9): 2 via RESPIRATORY_TRACT
  Filled 2016-02-27: qty 6

## 2016-02-27 MED ORDER — DIPHENHYDRAMINE HCL 50 MG/ML IJ SOLN
INTRAMUSCULAR | Status: AC
  Administered 2016-02-27: 25 mg via INTRAVENOUS
  Filled 2016-02-27: qty 1

## 2016-02-27 MED ORDER — FERROUS SULFATE 325 (65 FE) MG PO TABS
325.0000 mg | ORAL_TABLET | Freq: Every day | ORAL | Status: DC
  Administered 2016-02-27 – 2016-03-02 (×5): 325 mg via ORAL
  Filled 2016-02-27 (×5): qty 1

## 2016-02-27 MED ORDER — TIOTROPIUM BROMIDE MONOHYDRATE 18 MCG IN CAPS
18.0000 ug | ORAL_CAPSULE | Freq: Every day | RESPIRATORY_TRACT | Status: DC
  Administered 2016-02-27 – 2016-03-02 (×5): 18 ug via RESPIRATORY_TRACT
  Filled 2016-02-27 (×2): qty 5

## 2016-02-27 MED ORDER — SODIUM CHLORIDE 0.9 % IV SOLN
INTRAVENOUS | Status: DC
  Administered 2016-02-27: 125 mL/h via INTRAVENOUS

## 2016-02-27 MED ORDER — FLUOXETINE HCL 20 MG PO CAPS
40.0000 mg | ORAL_CAPSULE | Freq: Every day | ORAL | Status: DC
  Administered 2016-02-27 – 2016-03-02 (×5): 40 mg via ORAL
  Filled 2016-02-27 (×5): qty 2

## 2016-02-27 NOTE — ED Provider Notes (Signed)
[facility] Emergency Department Provider Note  ____________________________________________  Time seen: Approximately 5:56 AM  I have reviewed the triage vital signs and the nursing notes.   HISTORY  Chief Complaint Respiratory Distress    HPI Denise Valentine is a 45 y.o. female history of COPD, asthma, GERD, hep C presents for evaluation of shortness of breath with wheezing since yesterday, gradual onset, initially mild but now severe, only intermittently improving with nebulizer treatments at home. Patient reports that she started having dry cough yesterday as well as subjective fever/chills. She has had worsening shortness of breath since that time. She administered 2 nebulizer treatments prior to coming to the emergency department this evening because she continued to be short of breath. She denies any chest pain. No abdominal pain, vomiting, diarrhea, dysuria.   Past Medical History  Diagnosis Date  . Depression   . Anxiety   . Seizures (HCC)   . GERD (gastroesophageal reflux disease)   . Hepatitis C   . Ovarian cyst   . Asthma   . COPD (chronic obstructive pulmonary disease) (HCC)     There are no active problems to display for this patient.   Past Surgical History  Procedure Laterality Date  . Back surgery    . Tubal ligation    . Tonsillectomy    . Cholecystectomy    . Wisdom tooth extraction      Current Outpatient Rx  Name  Route  Sig  Dispense  Refill  . albuterol (ACCUNEB) 1.25 MG/3ML nebulizer solution   Nebulization   Take 1 ampule by nebulization every 6 (six) hours as needed for wheezing.         . budesonide-formoterol (SYMBICORT) 160-4.5 MCG/ACT inhaler   Inhalation   Inhale 2 puffs into the lungs 2 (two) times daily.         . buprenorphine-naloxone (SUBOXONE) 8-2 MG SUBL SL tablet   Sublingual   Place 1 tablet under the tongue 3 (three) times daily.         . ferrous sulfate (EQL SLOW RELEASE IRON) 160 (50 Fe) MG  TBCR SR tablet   Oral   Take 1 tablet (160 mg total) by mouth daily.   30 each   6   . FLUoxetine (PROZAC) 40 MG capsule   Oral   Take 40 mg by mouth daily.         . furosemide (LASIX) 20 MG tablet   Oral   Take 1 tablet (20 mg total) by mouth daily.   30 tablet   11   . gabapentin (NEURONTIN) 300 MG capsule   Oral   Take 300 mg by mouth 3 (three) times daily.         Marland Kitchen ibuprofen (ADVIL,MOTRIN) 800 MG tablet   Oral   Take 1 tablet (800 mg total) by mouth every 8 (eight) hours as needed for moderate pain.   15 tablet   0   . pantoprazole (PROTONIX) 40 MG tablet   Oral   Take 40 mg by mouth daily.         Marland Kitchen spironolactone (ALDACTONE) 25 MG tablet   Oral   Take 25 mg by mouth daily.         . traZODone (DESYREL) 100 MG tablet   Oral   Take 100 mg by mouth at bedtime.         Marland Kitchen albuterol (PROVENTIL HFA;VENTOLIN HFA) 108 (90 Base) MCG/ACT inhaler   Inhalation   Inhale 2 puffs  into the lungs every 6 (six) hours as needed for wheezing or shortness of breath.   1 Inhaler   0   . azithromycin (ZITHROMAX Z-PAK) 250 MG tablet      Take 2 tablets (500 mg) on  Day 1,  followed by 1 tablet (250 mg) once daily on Days 2 through 5.   6 each   0   . predniSONE (DELTASONE) 20 MG tablet   Oral   Take 3 tablets (60 mg total) by mouth daily.   15 tablet   0     Allergies Review of patient's allergies indicates no known allergies.  History reviewed. No pertinent family history.  Social History Social History  Substance Use Topics  . Smoking status: Current Every Day Smoker -- 5.00 packs/day    Types: Cigarettes  . Smokeless tobacco: None  . Alcohol Use: No    Review of Systems Constitutional: + subjective fever/chills Eyes: No visual changes. ENT: No sore throat. Cardiovascular: Denies chest pain. Respiratory: + shortness of breath. Gastrointestinal: No abdominal pain.  No nausea, no vomiting.  No diarrhea.  No constipation. Genitourinary: Negative  for dysuria. Musculoskeletal: Negative for back pain. Skin: Negative for rash. Neurological: Negative for headaches, focal weakness or numbness.  10-point ROS otherwise negative.  ____________________________________________   PHYSICAL EXAM:  VITAL SIGNS: ED Triage Vitals  Enc Vitals Group     BP 02/27/16 0550 122/71 mmHg     Pulse Rate 02/27/16 0550 92     Resp 02/27/16 0550 22     Temp 02/27/16 0550 98.8 F (37.1 C)     Temp Source 02/27/16 0550 Oral     SpO2 02/27/16 0550 95 %     Weight 02/27/16 0550 214 lb (97.07 kg)     Height 02/27/16 0550  (1.702 m)     Head Cir --      Peak Flow --      Pain Score 02/27/16 0545 0     Pain Loc --      Pain Edu? --      Excl. in GC? --     Constitutional: Alert and oriented. In moderate respiratory distress, audible expiratory wheeze wheezing can be heard from the door. Patient speaking in short sentences. Eyes: Conjunctivae are normal. PERRL. EOMI. Head: Atraumatic. Nose: No congestion/rhinnorhea. Mouth/Throat: Mucous membranes are moist.  Oropharynx non-erythematous. Neck: No stridor. Supple without meningismus. ardiovascular: Normal rate, regular rhythm. Grossly normal heart sounds.  Good peripheral circulation. Respiratory: Increased work of breathing, prolonged respiratory phase, audible auditory wheeze. Tachypnea. Gastrointestinal: Soft and nontender. No distention. No CVA tenderness. Genitourinary: deferred Musculoskeletal: No lower extremity tenderness nor edema.  No joint effusions. Neurologic:  Normal speech and language. No gross focal neurologic deficits are appreciated. No gait instability. Skin:  Skin is warm, dry and intact. No rash noted. Psychiatric: Mood and affect are normal. Speech and behavior are normal.  ____________________________________________   LABS (all labs ordered are listed, but only abnormal results are displayed)  Labs Reviewed  CBC WITH DIFFERENTIAL/PLATELET - Abnormal; Notable for  the following:    MCV 73.8 (*)    MCH 23.9 (*)    RDW 20.5 (*)    All other components within normal limits  BASIC METABOLIC PANEL - Abnormal; Notable for the following:    Sodium 134 (*)    Chloride 100 (*)    CO2 21 (*)    Glucose, Bld 111 (*)    Calcium 8.8 (*)    All  other components within normal limits  RAPID INFLUENZA A&B ANTIGENS ([facility] ONLY)  CULTURE, BLOOD (ROUTINE X 2)  CULTURE, BLOOD (ROUTINE X 2)   ____________________________________________  EKG  ED ECG REPORT I, Gayla DossGayle, Pauleen Goleman A, the attending physician, personally viewed and interpreted this ECG.   Date: 02/27/2016  EKG Time: 05:47  Rate: 96  Rhythm: normal EKG, normal sinus rhythm  Axis: normal  Intervals:none  ST&T Change: No Acute ST elevation.  ____________________________________________  RADIOLOGY  CXR IMPRESSION: Chronic bronchitic changes. No pneumonia or edema. ____________________________________________   PROCEDURES  Procedure(s) performed: None  Critical Care performed: No  ____________________________________________   INITIAL IMPRESSION / ASSESSMENT AND PLAN / ED COURSE  Pertinent labs & imaging results that were available during my care of the patient were reviewed by me and considered in my medical decision making (see chart for details).  Denise Valentine is a 45 y.o. female history of COPD, asthma, GERD, hep C presents for evaluation of shortness of breath with wheezing since yesterday. On arrival to the emergency department she is in moderate respiratory distress with accessory wheeze which is audible from the doorway. Currently, no oxygen requirement. Suspect COPD exacerbation in the setting of viral illness. We will give DuoNeb treatments, Solu-Medrol, obtain screening labs, chest x-ray, reassess for disposition.  ----------------------------------------- 6:55 AM on 02/27/2016 -----------------------------------------  Patient with only minimal improvement of her wheezing  after 2 DuoNeb treatments, we'll give an additional DuoNeb treatment. At this time, her oxygen saturation is 88% on room air, improves to 94% with 2 L supplemental oxygen. Blood cultures ordered, we'll give IV azithromycin. Case discussed with the hospitalist, Dr. Sheryle Haildiamond, at this time for admission. ____________________________________________   FINAL CLINICAL IMPRESSION(S) / ED DIAGNOSES  Final diagnoses:  COPD exacerbation (HCC)      Gayla DossEryka A Elyse Prevo, MD 02/27/16 [phone]

## 2016-02-27 NOTE — Progress Notes (Signed)
2:45pm: Patient rested today - states she feels improved since this am. Remains short of breath - especially with activity. Has rough cough and sounds wheezy. O2 continues and all medications given as ordered. No acute distress noted.

## 2016-02-27 NOTE — ED Notes (Signed)
Patient called RN to room c/o IV burning. Patients arm with redness where IV antibiotics infusing. Azithromycin was stopped and line flushed.  Dr. Scotty CourtStafford informed that patient c/o burning and redness at IV site, verbal order given for Benadryl 25 mg IVP.  Admitting MD paged and informed of reaction to antibiotic and that Benadryl 25 mg was given. Per Dr. Clint GuyHower, he will review patients chart and put in new order.

## 2016-02-27 NOTE — Progress Notes (Signed)
[facility] [place] [facility]                                                                                                                                                                                            Patient Demographics   Denise Valentine, is a 45 y.o. female, DOB - 10/09/1971, ZOX:[id number]  Admit date - 02/27/2016   Admitting Physician Arnaldo NatalMichael S Diamond, MD  Outpatient Primary MD for the patient is SALVADOR,VIVIAN, DO   LOS - 0  Subjective: Pt admitted with sob and cough, and hypoxia given azithromycin and had adverse reaction to that so switched to Levaquin.     Review of Systems:   CONSTITUTIONAL: No documented fever. No fatigue, weakness. No weight gain, no weight loss.  EYES: No blurry or double vision.  ENT: No tinnitus. No postnasal drip. No redness of the oropharynx.  RESPIRATORY:  postive cough, positive wheeze, no hemoptysis.  postive dyspnea.  CARDIOVASCULAR: No chest pain. No orthopnea. No palpitations. No syncope.  GASTROINTESTINAL: No nausea, no vomiting or diarrhea. No abdominal pain. No melena or hematochezia.  GENITOURINARY: No dysuria or hematuria.  ENDOCRINE: No polyuria or nocturia. No heat or cold intolerance.  HEMATOLOGY: No anemia. No bruising. No bleeding.  INTEGUMENTARY: No rashes. No lesions.  MUSCULOSKELETAL: No arthritis. No swelling. No gout.  NEUROLOGIC: No numbness, tingling, or ataxia. No seizure-type activity.  PSYCHIATRIC: No anxiety. No insomnia. No ADD.    Vitals:   Filed Vitals:   02/27/16 0654 02/27/16 0700 02/27/16 0803 02/27/16 0832  BP: 121/68 139/70 114/60 122/69  Pulse: 102 91 95 99  Temp:    99.1 F (37.3 C)  TempSrc:    Oral  Resp: 26 26 20 21   Height:    5\' 7"  (1.702 m)  Weight:    96.645 kg (213 lb 1 oz)  SpO2: 88% 92% 94% 94%    Wt Readings from Last 3 Encounters:  02/27/16 96.645 kg (213 lb 1 oz)  01/02/16 97.523 kg (215 lb)  12/27/15 93.895 kg (207 lb)    No intake or  output data in the 24 hours ending 02/27/16 1300  Physical Exam:   GENERAL: Pleasant-appearing in no apparent distress.  HEAD, EYES, EARS, NOSE AND THROAT: Atraumatic, normocephalic. Extraocular muscles are intact. Pupils equal and reactive to light. Sclerae anicteric. No conjunctival injection. No oro-pharyngeal erythema.  NECK: Supple. There is no jugular venous distention. No bruits, no lymphadenopathy, no thyromegaly.  HEART: Regular rate and rhythm,. No murmurs, no rubs, no clicks.  LUNGS: diffuse wheezing both lungs, no ronchi, or crackles ABDOMEN: Soft, flat, nontender, nondistended. Has  good bowel sounds. No hepatosplenomegaly appreciated.  EXTREMITIES: No evidence of any cyanosis, clubbing, or peripheral edema.  +2 pedal and radial pulses bilaterally.  NEUROLOGIC: The patient is alert, awake, and oriented x3 with no focal motor or sensory deficits appreciated bilaterally.  SKIN: Moist and warm with no rashes appreciated.  Psych: Not anxious, depressed LN: No inguinal LN enlargement    Antibiotics   Anti-infectives    Start     Dose/Rate Route Frequency Ordered Stop   02/28/16 1000  azithromycin (ZITHROMAX) tablet 250 mg  Status:  Discontinued     250 mg Oral Daily 02/27/16 0834 02/27/16 0858   02/28/16 1000  levofloxacin (LEVAQUIN) IVPB 500 mg     500 mg 100 mL/hr over 60 Minutes Intravenous Every 24 hours 02/27/16 0901     02/27/16 0915  levofloxacin (LEVAQUIN) IVPB 500 mg     500 mg 100 mL/hr over 60 Minutes Intravenous  Once 02/27/16 0901 02/27/16 1229   02/27/16 0800  levofloxacin (LEVAQUIN) IVPB 500 mg  Status:  Discontinued     500 mg 100 mL/hr over 60 Minutes Intravenous Every 24 hours 02/27/16 0748 02/27/16 0901   02/27/16 0700  azithromycin (ZITHROMAX) 500 mg in dextrose 5 % 250 mL IVPB     500 mg 250 mL/hr over 60 Minutes Intravenous  Once 02/27/16 0654 02/27/16 0738   02/27/16 0000  azithromycin (ZITHROMAX Z-PAK) 250 MG tablet        02/27/16 0638 03/03/16 2359       Medications   Scheduled Meds: . antiseptic oral rinse  7 mL Mouth Rinse BID  . budesonide-formoterol  2 puff Inhalation BID  . buprenorphine  8 mg Sublingual TID  . docusate sodium  100 mg Oral BID  . enoxaparin (LOVENOX) injection  40 mg Subcutaneous Q24H  . ferrous sulfate  325 mg Oral Q breakfast  . FLUoxetine  40 mg Oral Daily  . furosemide  20 mg Oral Daily  . gabapentin  300 mg Oral TID  . guaiFENesin  600 mg Oral BID  . ipratropium-albuterol  3 mL Nebulization Q6H  . [START ON 02/28/2016] levofloxacin (LEVAQUIN) IV  500 mg Intravenous Q24H  . methylPREDNISolone (SOLU-MEDROL) injection  60 mg Intravenous Q6H  . nicotine  21 mg Transdermal Daily  . pantoprazole  40 mg Oral Daily  . sodium chloride flush  3 mL Intravenous Q12H  . spironolactone  25 mg Oral Daily  . tiotropium  18 mcg Inhalation Daily  . traZODone  100 mg Oral QHS   Continuous Infusions:  PRN Meds:.acetaminophen **OR** acetaminophen, guaiFENesin-codeine, ibuprofen, ondansetron **OR** ondansetron (ZOFRAN) IV   Data Review:   Micro Results Recent Results (from the past 240 hour(s))  Rapid Influenza A&B Antigens ([facility] only)     Status: None   Collection Time: 02/27/16  6:12 AM  Result Value Ref Range Status   Influenza A ([facility]) NEGATIVE NEGATIVE Final   Influenza B ([facility]) NEGATIVE NEGATIVE Final  Blood culture (routine x 2)     Status: None (Preliminary result)   Collection Time: 02/27/16  7:14 AM  Result Value Ref Range Status   Specimen Description BLOOD RIGHT WRIST  Final   Special Requests BOTTLES DRAWN AEROBIC AND ANAEROBIC 5CCAERO,2CCANA  Final   Culture NO GROWTH < 12 HOURS  Final   Report Status PENDING  Incomplete  Blood culture (routine x 2)     Status: None (Preliminary result)   Collection Time: 02/27/16  7:14 AM  Result Value Ref Range  Status   Specimen Description BLOOD LEFT WRIST  Final   Special Requests BOTTLES DRAWN AEROBIC AND ANAEROBIC 5CCAERO,1CCANA  Final   Culture NO  GROWTH < 12 HOURS  Final   Report Status PENDING  Incomplete    Radiology Reports Dg Chest 2 View  02/27/2016  CLINICAL DATA:  Shortness of breath EXAM: CHEST  2 VIEW COMPARISON:  01/03/2016 FINDINGS: Chronic interstitial coarsening. There is no edema, consolidation, effusion, or pneumothorax. Normal heart size and aortic contours. IMPRESSION: Chronic bronchitic changes.  No pneumonia or edema. Electronically Signed   By: Marnee Spring M.D.   On: 02/27/2016 07:07     CBC  Recent Labs Lab 02/27/16 0612  WBC 6.1  HGB 12.2  HCT 37.8  PLT 238  MCV 73.8*  MCH 23.9*  MCHC 32.3  RDW 20.5*  LYMPHSABS 1.4  MONOABS 0.8  EOSABS 0.1  BASOSABS 0.0    Chemistries   Recent Labs Lab 02/27/16 0612  NA 134*  K 4.0  CL 100*  CO2 21*  GLUCOSE 111*  BUN 7  CREATININE 0.71  CALCIUM 8.8*   ------------------------------------------------------------------------------------------------------------------ estimated creatinine clearance is 107.1 mL/min (by C-G formula based on Cr of 0.71). ------------------------------------------------------------------------------------------------------------------ No results for input(s): HGBA1C in the last 72 hours. ------------------------------------------------------------------------------------------------------------------ No results for input(s): CHOL, HDL, LDLCALC, TRIG, CHOLHDL, LDLDIRECT in the last 72 hours. ------------------------------------------------------------------------------------------------------------------  Recent Labs  02/27/16 0612  TSH 5.004*   ------------------------------------------------------------------------------------------------------------------ No results for input(s): VITAMINB12, FOLATE, FERRITIN, TIBC, IRON, RETICCTPCT in the last 72 hours.  Coagulation profile No results for input(s): INR, PROTIME in the last 168 hours.  No results for input(s): DDIMER in the last 72 hours.  Cardiac Enzymes No  results for input(s): CKMB, TROPONINI, MYOGLOBIN in the last 168 hours.  Invalid input(s): CK ------------------------------------------------------------------------------------------------------------------ Invalid input(s): POCBNP    Assessment & Plan   This is a 45 year old female with COPD admitted for acute on chronic respiratory failure with hypoxia. 1. Acute on chronic respiratory failure:acute on chronic on copd flare I will start patient on IV Solu-Medrol she was on prednisone which I will discontinue. Change her nebs from albuterol to DuoNeb's every 6 hours. Due to her acute cough I will place her on Mucinex. 2. Acute on chronic COPD exasperation: Continue current therapy with inhalers 3. Sepsis: Due to acute bronchitis chest x-ray negative, influenza A and B- 4. Depression: Stable; continue fluoxetine 5. Hepatitis C: currently not undergoing treatment outpatient follow-up with GI 6. Chronic pain: Continue Subutex 7. Congestive heart failure: Type unknown currently compensated no evidence of exasperation 8. DVT prophylaxis: Continue Lovenox 9. GI prophylaxis: None     Code Status Orders        Start     Ordered   02/27/16 0835  Full code   Continuous     02/27/16 0834    Code Status History    Date Active Date Inactive Code Status Order ID Comments User Context   This patient has a current code status but no historical code status.           Consults  none DVT Prophylaxis  Lovenox  Lab Results  Component Value Date   PLT 238 02/27/2016     Time Spent in minutes    Greater than 50% of time spent in care coordination and counseling patient regarding the condition and plan of care.   Auburn Bilberry M.D on 02/27/2016 at 1:00 PM  Between 7am to 6pm - Pager - [phone]  After 6pm go  to [email or web address] - password [email or web address] [facility]   Office  [phone]

## 2016-02-27 NOTE — ED Notes (Signed)
Report received from [place]Matt, [place]  Patient with audible wheezing and increased respiratory efforts. Patient able to speak in 1-2 word sentences.  Dr. Sheryle Hailiamond informed and order given for another duoneb treatment.

## 2016-02-27 NOTE — ED Notes (Signed)
Pt presents w/ audible expiratory wheezing and rhonchi, labored, prolonged expiratory phase. Pt reports fever starting yesterday. Pt has moist, non-productive cough. Pt has hx of COPD. Pt still smokes.

## 2016-02-27 NOTE — Progress Notes (Signed)
10:15 Patient arrived from ED. Settled in bed. Very short of breath and wheezy - respiratory tech in to see patient and give nebulizer. IV fluids started, medications given. HR - ST 100's. O2 at 3L [place] to keep O2 sat around 94%. Husband at bedside.

## 2016-02-27 NOTE — H&P (Signed)
Denise Valentine is an 45 y.o. female.   Chief Complaint: Shortness of breath HPI: The patient presents emergency department complaining of shortness of breath and coughing that began yesterday afternoon. She had been using her nebulizer treatments to some effect but became acutely more short of breath on the early morning of admission. She admits to fevers up to 101F as well as chills, nausea and posttussive emesis. She denies chest pain except for when she coughs. She denies diarrhea but has a sick contact and her husband who has viral respiratory symptoms. The patient still smokes. Past medical history is significant for emphysema. In the emergency department the patient received a DuoNeb as well as steroids but remained hypoxic to 88% on room air which brought to the emergency department staff to call the hospitalist service for admission.  Past Medical History  Diagnosis Date  . Depression   . Anxiety   . Seizures ([place])   . GERD (gastroesophageal reflux disease)   . Hepatitis C   . Ovarian cyst   . Asthma   . COPD (chronic obstructive pulmonary disease) [facility])     Past Surgical History  Procedure Laterality Date  . Back surgery    . Tubal ligation    . Tonsillectomy    . Cholecystectomy    . Wisdom tooth extraction      Family History  Problem Relation Age of Onset  . CAD Mother    Social History:  reports that she has been smoking Cigarettes.  She has been smoking about 5.00 packs per day. She does not have any smokeless tobacco history on file. She reports that she does not drink alcohol or use illicit drugs.  Allergies: No Known Allergies  Prior to Admission medications   Medication Sig Start Date End Date Taking? Authorizing Provider  ADVAIR DISKUS 250-50 MCG/DOSE AEPB Inhale 1 puff into the lungs 2 (two) times daily. 02/17/16  Yes Historical Provider, MD  albuterol (PROVENTIL) (2.5 MG/3ML) 0.083% nebulizer solution Inhale 3 mLs into the lungs every 6 (six) hours as needed.  02/17/16  Yes Historical Provider, MD  budesonide-formoterol (SYMBICORT) 160-4.5 MCG/ACT inhaler Inhale 2 puffs into the lungs 2 (two) times daily.   Yes Historical Provider, MD  buprenorphine-naloxone (SUBOXONE) 8-2 MG SUBL SL tablet Place 1 tablet under the tongue 3 (three) times daily.   Yes Historical Provider, MD  ferrous sulfate (EQL SLOW RELEASE IRON) 160 (50 Fe) MG TBCR SR tablet Take 1 tablet (160 mg total) by mouth daily. 12/27/15  Yes Earleen Newport, MD  FLUoxetine (PROZAC) 40 MG capsule Take 40 mg by mouth daily.   Yes Historical Provider, MD  furosemide (LASIX) 20 MG tablet Take 1 tablet (20 mg total) by mouth daily. 01/03/16 01/02/17 Yes Nena Polio, MD  gabapentin (NEURONTIN) 300 MG capsule Take 300 mg by mouth 3 (three) times daily.   Yes Historical Provider, MD  ibuprofen (ADVIL,MOTRIN) 800 MG tablet Take 1 tablet (800 mg total) by mouth every 8 (eight) hours as needed for moderate pain. 10/26/15  Yes Sable Feil, PA-C  pantoprazole (PROTONIX) 40 MG tablet Take 40 mg by mouth daily.   Yes Historical Provider, MD  spironolactone (ALDACTONE) 25 MG tablet Take 25 mg by mouth daily.   Yes Historical Provider, MD  traZODone (DESYREL) 100 MG tablet Take 100 mg by mouth at bedtime.   Yes Historical Provider, MD  albuterol (PROVENTIL HFA;VENTOLIN HFA) 108 (90 Base) MCG/ACT inhaler Inhale 2 puffs into the lungs every 6 (six)  hours as needed for wheezing or shortness of breath. 02/27/16   Joanne Gavel, MD  azithromycin (ZITHROMAX Z-PAK) 250 MG tablet Take 2 tablets (500 mg) on  Day 1,  followed by 1 tablet (250 mg) once daily on Days 2 through 5. 02/27/16 03/03/16  Joanne Gavel, MD  predniSONE (DELTASONE) 20 MG tablet Take 3 tablets (60 mg total) by mouth daily. 02/27/16   Joanne Gavel, MD     Results for orders placed or performed during the hospital encounter of 02/27/16 (from the past 48 hour(s))  CBC with Differential     Status: Abnormal   Collection Time: 02/27/16  6:12 AM  Result  Value Ref Range   WBC 6.1 3.6 - 11.0 K/uL   RBC 5.13 3.80 - 5.20 MIL/uL   Hemoglobin 12.2 12.0 - 16.0 g/dL   HCT 37.8 35.0 - 47.0 %   MCV 73.8 (L) 80.0 - 100.0 fL   MCH 23.9 (L) 26.0 - 34.0 pg   MCHC 32.3 32.0 - 36.0 g/dL   RDW 20.5 (H) 11.5 - 14.5 %   Platelets 238 150 - 440 K/uL   Neutrophils Relative % 63 %   Neutro Abs 3.9 1.4 - 6.5 K/uL   Lymphocytes Relative 22 %   Lymphs Abs 1.4 1.0 - 3.6 K/uL   Monocytes Relative 13 %   Monocytes Absolute 0.8 0.2 - 0.9 K/uL   Eosinophils Relative 1 %   Eosinophils Absolute 0.1 0 - 0.7 K/uL   Basophils Relative 1 %   Basophils Absolute 0.0 0 - 0.1 K/uL  Basic metabolic panel     Status: Abnormal   Collection Time: 02/27/16  6:12 AM  Result Value Ref Range   Sodium 134 (L) 135 - 145 mmol/L   Potassium 4.0 3.5 - 5.1 mmol/L   Chloride 100 (L) 101 - 111 mmol/L   CO2 21 (L) 22 - 32 mmol/L   Glucose, Bld 111 (H) 65 - 99 mg/dL   BUN 7 6 - 20 mg/dL   Creatinine, Ser 0.71 0.44 - 1.00 mg/dL   Calcium 8.8 (L) 8.9 - 10.3 mg/dL   GFR calc non Af Amer >60 >60 mL/min   GFR calc Af Amer >60 >60 mL/min    Comment: (NOTE) The eGFR has been calculated using the CKD EPI equation. This calculation has not been validated in all clinical situations. eGFR's persistently <60 mL/min signify possible Chronic Kidney Disease.    Anion gap 13 5 - 15   Dg Chest 2 View  02/27/2016  CLINICAL DATA:  Shortness of breath EXAM: CHEST  2 VIEW COMPARISON:  01/03/2016 FINDINGS: Chronic interstitial coarsening. There is no edema, consolidation, effusion, or pneumothorax. Normal heart size and aortic contours. IMPRESSION: Chronic bronchitic changes.  No pneumonia or edema. Electronically Signed   By: Monte Fantasia M.D.   On: 02/27/2016 07:07    Review of Systems  Constitutional: Positive for fever and chills.  HENT: Negative for sore throat and tinnitus.   Eyes: Negative for blurred vision and redness.  Respiratory: Positive for shortness of breath and wheezing.  Negative for cough.   Cardiovascular: Negative for chest pain, palpitations, orthopnea and PND.  Gastrointestinal: Positive for nausea and vomiting (post-tussive). Negative for abdominal pain and diarrhea.  Genitourinary: Negative for dysuria, urgency and frequency.  Musculoskeletal: Negative for myalgias and joint pain.  Skin: Negative for rash.       No lesions  Neurological: Negative for speech change, focal weakness and weakness.  Endo/Heme/Allergies:  Does not bruise/bleed easily.       No temperature intolerance  Psychiatric/Behavioral: Negative for depression and suicidal ideas.    Blood pressure 121/68, pulse 102, temperature 98.8 F (37.1 C), temperature source Oral, resp. rate 26, height '5\' 7"'  (1.702 m), weight 97.07 kg (214 lb), last menstrual period 02/21/2016, SpO2 88 %. Physical Exam  Vitals reviewed. Constitutional: She is oriented to person, place, and time. She appears well-developed and well-nourished. No distress.  HENT:  Head: Normocephalic and atraumatic.  Mouth/Throat: Oropharynx is clear and moist.  Eyes: Conjunctivae and EOM are normal. Pupils are equal, round, and reactive to light. No scleral icterus.  Neck: Normal range of motion. Neck supple. No JVD present. No tracheal deviation present. No thyromegaly present.  Cardiovascular: Normal rate, regular rhythm and normal heart sounds.  Exam reveals no gallop and no friction rub.   No murmur heard. Respiratory: Effort normal. Tachypnea noted. She has wheezes (prolonged expiratory).  GI: Soft. Bowel sounds are normal. She exhibits no distension. There is no tenderness.  Genitourinary:  Deferred  Musculoskeletal: Normal range of motion. She exhibits no edema.  Lymphadenopathy:    She has no cervical adenopathy.  Neurological: She is alert and oriented to person, place, and time. No cranial nerve deficit. She exhibits normal muscle tone.  Skin: Skin is warm and dry. No rash noted. No erythema.  Psychiatric: She  has a normal mood and affect. Her behavior is normal. Judgment and thought content normal.     Assessment/Plan This is a 45 year old female with COPD admitted for acute on chronic respiratory failure with hypoxia. 1. Acute on chronic respiratory failure: Hypoxia present; supplemental oxygen as needed to maintain oxygen saturations 88-92%. We will start azithromycin for anti-inflammatory affect and continue steroid taper. Albuterol as needed 2. COPD: Continue inhaled corticosteroid with long-acting bronchial agonist. Add Spiriva. 3. Sepsis: The patient technically meets criteria via tachycardia and tachypnea although both are present likely secondary to COPD exacerbation. Blood cultures admin obtained in the emergency department. Follow for growth and sensitivities. Check for influenza A and B 4. Depression: Stable; continue fluoxetine 5. Hepatitis C: currently not undergoing treatment 6. Chronic pain: Continue Subutex 7. Congestive heart failure: Not clear when this diagnosis was made but the patient is on Lasix and spironolactone which we will continue 8. DVT prophylaxis: Lovenox 9. GI prophylaxis: None The patient is a full code. Time spent on admission was inpatient care approximately 45 minutes  Harrie Foreman, MD 02/27/2016, 7:09 AM

## 2016-02-27 NOTE — Progress Notes (Signed)
Informed by nursing staff while patient still in ED - apparent allergic reaction to azithromycin - redness/burning at IV site -- medication stopped, given benadryl, will change to levaquin - check procalcitonin as well

## 2016-02-28 LAB — CBC
HEMATOCRIT: 37.8 % (ref 35.0–47.0)
HEMOGLOBIN: 12.4 g/dL (ref 12.0–16.0)
MCH: 24.2 pg — AB (ref 26.0–34.0)
MCHC: 32.7 g/dL (ref 32.0–36.0)
MCV: 73.8 fL — ABNORMAL LOW (ref 80.0–100.0)
Platelets: 266 10*3/uL (ref 150–440)
RBC: 5.13 MIL/uL (ref 3.80–5.20)
RDW: 20.3 % — ABNORMAL HIGH (ref 11.5–14.5)
WBC: 5.5 10*3/uL (ref 3.6–11.0)

## 2016-02-28 LAB — BASIC METABOLIC PANEL
Anion gap: 5 (ref 5–15)
BUN: 10 mg/dL (ref 6–20)
CHLORIDE: 106 mmol/L (ref 101–111)
CO2: 26 mmol/L (ref 22–32)
CREATININE: 0.71 mg/dL (ref 0.44–1.00)
Calcium: 8.7 mg/dL — ABNORMAL LOW (ref 8.9–10.3)
GFR calc non Af Amer: >60 mL/min (ref >60)
Glucose, Bld: 184 mg/dL — ABNORMAL HIGH (ref 65–99)
Potassium: 4.1 mmol/L (ref 3.5–5.1)
Sodium: 137 mmol/L (ref 135–145)

## 2016-02-28 MED ORDER — GUAIFENESIN-CODEINE 100-10 MG/5ML PO SOLN
10.0000 mL | ORAL | Status: DC | PRN
  Administered 2016-02-28 – 2016-03-02 (×13): 10 mL via ORAL
  Filled 2016-02-28 (×13): qty 10

## 2016-02-28 MED ORDER — LEVOFLOXACIN 500 MG PO TABS
500.0000 mg | ORAL_TABLET | Freq: Every day | ORAL | Status: DC
  Administered 2016-02-29 – 2016-03-02 (×3): 500 mg via ORAL
  Filled 2016-02-28 (×3): qty 1

## 2016-02-28 MED ORDER — POLYETHYLENE GLYCOL 3350 17 G PO PACK
17.0000 g | PACK | Freq: Every day | ORAL | Status: DC
  Administered 2016-02-28 – 2016-02-29 (×2): 17 g via ORAL
  Filled 2016-02-28 (×3): qty 1

## 2016-02-28 MED ORDER — ACETYLCYSTEINE 20 % IN SOLN
4.0000 mL | Freq: Two times a day (BID) | RESPIRATORY_TRACT | Status: DC
  Administered 2016-02-28 – 2016-03-01 (×5): 4 mL via RESPIRATORY_TRACT
  Filled 2016-02-28 (×5): qty 4

## 2016-02-28 NOTE — Progress Notes (Signed)
[facility] - [place] at [facility]                                                                                                                                                                                            Patient Demographics   Denise Valentine, is a 45 y.o. female, DOB - 06/15/1971, ZOX:[id number]  Admit date - 02/27/2016   Admitting Physician Arnaldo NatalMichael S Diamond, MD  Outpatient Primary MD for the patient is SALVADOR,VIVIAN, DO   LOS - 1  Subjective: Patient's breathing is improved. Cough and wheezing improved as well. Complains of congestion unable to break up cough.      Review of Systems:   CONSTITUTIONAL: No documented fever. No fatigue, weakness. No weight gain, no weight loss.  EYES: No blurry or double vision.  ENT: No tinnitus. No postnasal drip. No redness of the oropharynx.  RESPIRATORY:  postive cough, positive wheeze, no hemoptysis.  postive dyspnea.  CARDIOVASCULAR: No chest pain. No orthopnea. No palpitations. No syncope.  GASTROINTESTINAL: No nausea, no vomiting or diarrhea. No abdominal pain. No melena or hematochezia.  GENITOURINARY: No dysuria or hematuria.  ENDOCRINE: No polyuria or nocturia. No heat or cold intolerance.  HEMATOLOGY: No anemia. No bruising. No bleeding.  INTEGUMENTARY: No rashes. No lesions.  MUSCULOSKELETAL: No arthritis. No swelling. No gout.  NEUROLOGIC: No numbness, tingling, or ataxia. No seizure-type activity.  PSYCHIATRIC: No anxiety. No insomnia. No ADD.    Vitals:   Filed Vitals:   02/27/16 2100 02/28/16 0218 02/28/16 0521 02/28/16 0836  BP:   113/50   Pulse:   72   Temp:   98.3 F (36.8 C)   TempSrc:   Oral   Resp:   20   Height:      Weight:   98.612 kg (217 lb 6.4 oz)   SpO2: 97% 91% 93% 95%    Wt Readings from Last 3 Encounters:  02/28/16 98.612 kg (217 lb 6.4 oz)  01/02/16 97.523 kg (215 lb)  12/27/15 93.895 kg (207 lb)    No intake or output data in the 24 hours ending  02/28/16 1213  Physical Exam:   GENERAL: Pleasant-appearing in no apparent distress.  HEAD, EYES, EARS, NOSE AND THROAT: Atraumatic, normocephalic. Extraocular muscles are intact. Pupils equal and reactive to light. Sclerae anicteric. No conjunctival injection. No oro-pharyngeal erythema.  NECK: Supple. There is no jugular venous distention. No bruits, no lymphadenopathy, no thyromegaly.  HEART: Regular rate and rhythm,. No murmurs, no rubs, no clicks.  LUNGS: diffuse wheezing both lungs, no ronchi, or crackles ABDOMEN: Soft, flat, nontender, nondistended. Has good bowel sounds. No  hepatosplenomegaly appreciated.  EXTREMITIES: No evidence of any cyanosis, clubbing, or peripheral edema.  +2 pedal and radial pulses bilaterally.  NEUROLOGIC: The patient is alert, awake, and oriented x3 with no focal motor or sensory deficits appreciated bilaterally.  SKIN: Moist and warm with no rashes appreciated.  Psych: Not anxious, depressed LN: No inguinal LN enlargement    Antibiotics   Anti-infectives    Start     Dose/Rate Route Frequency Ordered Stop   02/29/16 1000  levofloxacin (LEVAQUIN) tablet 500 mg     500 mg Oral Daily 02/28/16 1029     02/28/16 1000  azithromycin (ZITHROMAX) tablet 250 mg  Status:  Discontinued     250 mg Oral Daily 02/27/16 0834 02/27/16 0858   02/28/16 1000  levofloxacin (LEVAQUIN) IVPB 500 mg  Status:  Discontinued     500 mg 100 mL/hr over 60 Minutes Intravenous Every 24 hours 02/27/16 0901 02/28/16 1029   02/27/16 0915  levofloxacin (LEVAQUIN) IVPB 500 mg     500 mg 100 mL/hr over 60 Minutes Intravenous  Once 02/27/16 0901 02/27/16 1229   02/27/16 0800  levofloxacin (LEVAQUIN) IVPB 500 mg  Status:  Discontinued     500 mg 100 mL/hr over 60 Minutes Intravenous Every 24 hours 02/27/16 0748 02/27/16 0901   02/27/16 0700  azithromycin (ZITHROMAX) 500 mg in dextrose 5 % 250 mL IVPB     500 mg 250 mL/hr over 60 Minutes Intravenous  Once 02/27/16 0654 02/27/16 0738    02/27/16 0000  azithromycin (ZITHROMAX Z-PAK) 250 MG tablet        02/27/16 0638 03/03/16 2359      Medications   Scheduled Meds: . acetylcysteine  4 mL Nebulization BID  . antiseptic oral rinse  7 mL Mouth Rinse BID  . budesonide-formoterol  2 puff Inhalation BID  . buprenorphine  8 mg Sublingual TID  . docusate sodium  100 mg Oral BID  . enoxaparin (LOVENOX) injection  40 mg Subcutaneous Q24H  . ferrous sulfate  325 mg Oral Q breakfast  . FLUoxetine  40 mg Oral Daily  . furosemide  20 mg Oral Daily  . gabapentin  300 mg Oral TID  . guaiFENesin  600 mg Oral BID  . ipratropium-albuterol  3 mL Nebulization Q6H  . [START ON 02/29/2016] levofloxacin  500 mg Oral Daily  . methylPREDNISolone (SOLU-MEDROL) injection  60 mg Intravenous Q6H  . nicotine  21 mg Transdermal Daily  . pantoprazole  40 mg Oral Daily  . polyethylene glycol  17 g Oral Daily  . sodium chloride flush  3 mL Intravenous Q12H  . spironolactone  25 mg Oral Daily  . tiotropium  18 mcg Inhalation Daily  . traZODone  100 mg Oral QHS   Continuous Infusions:  PRN Meds:.acetaminophen **OR** acetaminophen, guaiFENesin-codeine, ibuprofen, ondansetron **OR** ondansetron (ZOFRAN) IV   Data Review:   Micro Results Recent Results (from the past 240 hour(s))  Rapid Influenza A&B Antigens ([facility] only)     Status: None   Collection Time: 02/27/16  6:12 AM  Result Value Ref Range Status   Influenza A ([facility]) NEGATIVE NEGATIVE Final   Influenza B ([facility]) NEGATIVE NEGATIVE Final  Blood culture (routine x 2)     Status: None (Preliminary result)   Collection Time: 02/27/16  7:14 AM  Result Value Ref Range Status   Specimen Description BLOOD RIGHT WRIST  Final   Special Requests BOTTLES DRAWN AEROBIC AND ANAEROBIC 5CCAERO,2CCANA  Final   Culture NO GROWTH 1 DAY  Final   Report Status PENDING  Incomplete  Blood culture (routine x 2)     Status: None (Preliminary result)   Collection Time: 02/27/16  7:14 AM  Result Value Ref  Range Status   Specimen Description BLOOD LEFT WRIST  Final   Special Requests BOTTLES DRAWN AEROBIC AND ANAEROBIC 5CCAERO,1CCANA  Final   Culture NO GROWTH 1 DAY  Final   Report Status PENDING  Incomplete    Radiology Reports Dg Chest 2 View  02/27/2016  CLINICAL DATA:  Shortness of breath EXAM: CHEST  2 VIEW COMPARISON:  01/03/2016 FINDINGS: Chronic interstitial coarsening. There is no edema, consolidation, effusion, or pneumothorax. Normal heart size and aortic contours. IMPRESSION: Chronic bronchitic changes.  No pneumonia or edema. Electronically Signed   By: Marnee Spring M.D.   On: 02/27/2016 07:07     CBC  Recent Labs Lab 02/27/16 0612 02/28/16 0411  WBC 6.1 5.5  HGB 12.2 12.4  HCT 37.8 37.8  PLT 238 266  MCV 73.8* 73.8*  MCH 23.9* 24.2*  MCHC 32.3 32.7  RDW 20.5* 20.3*  LYMPHSABS 1.4  --   MONOABS 0.8  --   EOSABS 0.1  --   BASOSABS 0.0  --     Chemistries   Recent Labs Lab 02/27/16 0612 02/28/16 0411  NA 134* 137  K 4.0 4.1  CL 100* 106  CO2 21* 26  GLUCOSE 111* 184*  BUN 7 10  CREATININE 0.71 0.71  CALCIUM 8.8* 8.7*   ------------------------------------------------------------------------------------------------------------------ estimated creatinine clearance is 108.2 mL/min (by C-G formula based on Cr of 0.71). ------------------------------------------------------------------------------------------------------------------  Recent Labs  02/27/16 0612  HGBA1C 5.3   ------------------------------------------------------------------------------------------------------------------ No results for input(s): CHOL, HDL, LDLCALC, TRIG, CHOLHDL, LDLDIRECT in the last 72 hours. ------------------------------------------------------------------------------------------------------------------  Recent Labs  02/27/16 0612  TSH 5.004*    ------------------------------------------------------------------------------------------------------------------ No results for input(s): VITAMINB12, FOLATE, FERRITIN, TIBC, IRON, RETICCTPCT in the last 72 hours.  Coagulation profile No results for input(s): INR, PROTIME in the last 168 hours.  No results for input(s): DDIMER in the last 72 hours.  Cardiac Enzymes No results for input(s): CKMB, TROPONINI, MYOGLOBIN in the last 168 hours.  Invalid input(s): CK ------------------------------------------------------------------------------------------------------------------ Invalid input(s): POCBNP    Assessment & Plan   This is a 45 year old female with COPD admitted for acute on chronic respiratory failure with hypoxia. 1. Acute on chronic respiratory failure:acute on chronic on copd flare continue IV solumedrol,  Continue symbicort,  2. Acute on chronic COPD exasperation: Continue current therapy with inhalers 3. Sepsis: Due to acute bronchitis chest x-ray negative, influenza A and B, continue levaquin 4. Depression: Stable; continue fluoxetine 5. Hepatitis C: currently not undergoing treatment outpatient follow-up with GI 6. Chronic pain: Continue Subutex 7. Congestive heart failure: Type unknown currently compensated no evidence of exasperation 8. DVT prophylaxis: Continue Lovenox 9. GI prophylaxis: None     Code Status Orders        Start     Ordered   02/27/16 0835  Full code   Continuous     02/27/16 0834    Code Status History    Date Active Date Inactive Code Status Order ID Comments User Context   This patient has a current code status but no historical code status.           Consults  none DVT Prophylaxis  Lovenox  Lab Results  Component Value Date   PLT 266 02/28/2016     Time Spent in minutes    Greater than  50% of time spent in care coordination and counseling patient regarding the condition and plan of care.   Auburn Bilberry  M.D on 02/28/2016 at 12:13 PM  Between 7am to 6pm - Pager - [phone]  After 6pm go to [email or web address] - password [email or web address] [facility]  [facility]   Office  [phone]

## 2016-02-28 NOTE — Progress Notes (Signed)
PHARMACIST - PHYSICIAN COMMUNICATION DR:   Patel CONCERNING: Antibiotic IV to Oral Route Change Policy  RECOMMENDATION: This patient is receiving Levofloxacin by the intravenous route.  Based on criteria approved by the Pharmacy and Therapeutics Committee, the antibiotic(s) is/are being converted to the equivalent oral dose form(s).   DESCRIPTION: These criteria include:  Patient being treated for a respiratory tract infection, urinary tract infection, cellulitis or clostridium difficile associated diarrhea if on metronidazole  The patient is not neutropenic and does not exhibit a GI malabsorption state  The patient is eating (either orally or via tube) and/or has been taking other orally administered medications for a least 24 hours  The patient is improving clinically and has a Tmax < 100.5  If you have questions about this conversion, please contact the Pharmacy Department  []  ( [phone] )  [place] [x]  ( [phone] )  [facility] []  ( [phone]  [place] []  ( [phone]  [facility] []  ( [phone] )  [facility]    Allie Ousley D. Jairo Bellew, PharmD  

## 2016-02-29 LAB — INFLUENZA PANEL BY PCR (TYPE A & B)
H1N1FLUPCR: NOT DETECTED
INFLBPCR: POSITIVE — AB
Influenza A By PCR: NEGATIVE

## 2016-02-29 LAB — PROCALCITONIN: Procalcitonin: <0.1 ng/mL

## 2016-02-29 MED ORDER — SENNA 8.6 MG PO TABS
1.0000 | ORAL_TABLET | Freq: Every day | ORAL | Status: DC
  Administered 2016-02-29 – 2016-03-02 (×3): 8.6 mg via ORAL
  Filled 2016-02-29 (×3): qty 1

## 2016-02-29 NOTE — Progress Notes (Signed)
[facility] [place] [facility]                                                                                                                                                                                            Patient Demographics   Denise Valentine, is a 45 y.o. female, DOB - 05/21/1971, ZOX:[id number]  Admit date - 02/27/2016   Admitting Physician Arnaldo NatalMichael S Diamond, MD  Outpatient Primary MD for the patient is SALVADOR,VIVIAN, DO   LOS - 2  Subjective: Shortness of breath is improved but still on 2 L of oxygen. Continues to have severe coughing episodes.   Review of Systems:   CONSTITUTIONAL: No documented fever. No fatigue, weakness. No weight gain, no weight loss.  EYES: No blurry or double vision.  ENT: No tinnitus. No postnasal drip. No redness of the oropharynx.  RESPIRATORY:  postive cough, positive wheeze, no hemoptysis.  postive dyspnea.  CARDIOVASCULAR: No chest pain. No orthopnea. No palpitations. No syncope.  GASTROINTESTINAL: No nausea, no vomiting or diarrhea. No abdominal pain. No melena or hematochezia.  GENITOURINARY: No dysuria or hematuria.  ENDOCRINE: No polyuria or nocturia. No heat or cold intolerance.  HEMATOLOGY: No anemia. No bruising. No bleeding.  INTEGUMENTARY: No rashes. No lesions.  MUSCULOSKELETAL: No arthritis. No swelling. No gout.  NEUROLOGIC: No numbness, tingling, or ataxia. No seizure-type activity.  PSYCHIATRIC: No anxiety. No insomnia. No ADD.    Vitals:   Filed Vitals:   02/29/16 0218 02/29/16 0536 02/29/16 0706 02/29/16 0848  BP:  109/50  128/60  Pulse:  76  82  Temp:  98.7 F (37.1 C)    TempSrc:  Oral    Resp:  18  18  Height:      Weight:      SpO2: 95% 95% 93% 95%    Wt Readings from Last 3 Encounters:  02/28/16 98.612 kg (217 lb 6.4 oz)  01/02/16 97.523 kg (215 lb)  12/27/15 93.895 kg (207 lb)    No intake or output data in the 24 hours ending 02/29/16 1202  Physical Exam:    GENERAL: Pleasant-appearing in no apparent distress.  HEAD, EYES, EARS, NOSE AND THROAT: Atraumatic, normocephalic. Extraocular muscles are intact. Pupils equal and reactive to light. Sclerae anicteric. No conjunctival injection. No oro-pharyngeal erythema.  NECK: Supple. There is no jugular venous distention. No bruits, no lymphadenopathy, no thyromegaly.  HEART: Regular rate and rhythm,. No murmurs, no rubs, no clicks.  LUNGS: diffuse wheezing both lungs, no ronchi, or crackle no accesory muscle usage ABDOMEN: Soft, flat, nontender, nondistended. Has good bowel sounds. No hepatosplenomegaly appreciated.  EXTREMITIES:  No evidence of any cyanosis, clubbing, or peripheral edema.  +2 pedal and radial pulses bilaterally.  NEUROLOGIC: The patient is alert, awake, and oriented x3 with no focal motor or sensory deficits appreciated bilaterally.  SKIN: Moist and warm with no rashes appreciated.  Psych: Not anxious, depressed LN: No inguinal LN enlargement    Antibiotics   Anti-infectives    Start     Dose/Rate Route Frequency Ordered Stop   02/29/16 1000  levofloxacin (LEVAQUIN) tablet 500 mg     500 mg Oral Daily 02/28/16 1029     02/28/16 1000  azithromycin (ZITHROMAX) tablet 250 mg  Status:  Discontinued     250 mg Oral Daily 02/27/16 0834 02/27/16 0858   02/28/16 1000  levofloxacin (LEVAQUIN) IVPB 500 mg  Status:  Discontinued     500 mg 100 mL/hr over 60 Minutes Intravenous Every 24 hours 02/27/16 0901 02/28/16 1029   02/27/16 0915  levofloxacin (LEVAQUIN) IVPB 500 mg     500 mg 100 mL/hr over 60 Minutes Intravenous  Once 02/27/16 0901 02/27/16 1229   02/27/16 0800  levofloxacin (LEVAQUIN) IVPB 500 mg  Status:  Discontinued     500 mg 100 mL/hr over 60 Minutes Intravenous Every 24 hours 02/27/16 0748 02/27/16 0901   02/27/16 0700  azithromycin (ZITHROMAX) 500 mg in dextrose 5 % 250 mL IVPB     500 mg 250 mL/hr over 60 Minutes Intravenous  Once 02/27/16 0654 02/27/16 0738   02/27/16  0000  azithromycin (ZITHROMAX Z-PAK) 250 MG tablet        02/27/16 0638 03/03/16 2359      Medications   Scheduled Meds: . acetylcysteine  4 mL Nebulization BID  . antiseptic oral rinse  7 mL Mouth Rinse BID  . budesonide-formoterol  2 puff Inhalation BID  . buprenorphine  8 mg Sublingual TID  . docusate sodium  100 mg Oral BID  . enoxaparin (LOVENOX) injection  40 mg Subcutaneous Q24H  . ferrous sulfate  325 mg Oral Q breakfast  . FLUoxetine  40 mg Oral Daily  . furosemide  20 mg Oral Daily  . gabapentin  300 mg Oral TID  . guaiFENesin  600 mg Oral BID  . ipratropium-albuterol  3 mL Nebulization Q6H  . levofloxacin  500 mg Oral Daily  . methylPREDNISolone (SOLU-MEDROL) injection  60 mg Intravenous Q6H  . nicotine  21 mg Transdermal Daily  . pantoprazole  40 mg Oral Daily  . polyethylene glycol  17 g Oral Daily  . senna  1 tablet Oral Daily  . sodium chloride flush  3 mL Intravenous Q12H  . spironolactone  25 mg Oral Daily  . tiotropium  18 mcg Inhalation Daily  . traZODone  100 mg Oral QHS   Continuous Infusions:  PRN Meds:.acetaminophen **OR** acetaminophen, guaiFENesin-codeine, ibuprofen, ondansetron **OR** ondansetron (ZOFRAN) IV   Data Review:   Micro Results Recent Results (from the past 240 hour(s))  Rapid Influenza A&B Antigens ([facility] only)     Status: None   Collection Time: 02/27/16  6:12 AM  Result Value Ref Range Status   Influenza A ([facility]) NEGATIVE NEGATIVE Final   Influenza B ([facility]) NEGATIVE NEGATIVE Final  Blood culture (routine x 2)     Status: None (Preliminary result)   Collection Time: 02/27/16  7:14 AM  Result Value Ref Range Status   Specimen Description BLOOD RIGHT WRIST  Final   Special Requests BOTTLES DRAWN AEROBIC AND ANAEROBIC 5CCAERO,2CCANA  Final   Culture NO GROWTH 2  DAYS  Final   Report Status PENDING  Incomplete  Blood culture (routine x 2)     Status: None (Preliminary result)   Collection Time: 02/27/16  7:14 AM  Result Value Ref  Range Status   Specimen Description BLOOD LEFT WRIST  Final   Special Requests BOTTLES DRAWN AEROBIC AND ANAEROBIC 5CCAERO,1CCANA  Final   Culture NO GROWTH 2 DAYS  Final   Report Status PENDING  Incomplete    Radiology Reports Dg Chest 2 View  02/27/2016  CLINICAL DATA:  Shortness of breath EXAM: CHEST  2 VIEW COMPARISON:  01/03/2016 FINDINGS: Chronic interstitial coarsening. There is no edema, consolidation, effusion, or pneumothorax. Normal heart size and aortic contours. IMPRESSION: Chronic bronchitic changes.  No pneumonia or edema. Electronically Signed   By: Marnee Spring M.D.   On: 02/27/2016 07:07     CBC  Recent Labs Lab 02/27/16 0612 02/28/16 0411  WBC 6.1 5.5  HGB 12.2 12.4  HCT 37.8 37.8  PLT 238 266  MCV 73.8* 73.8*  MCH 23.9* 24.2*  MCHC 32.3 32.7  RDW 20.5* 20.3*  LYMPHSABS 1.4  --   MONOABS 0.8  --   EOSABS 0.1  --   BASOSABS 0.0  --     Chemistries   Recent Labs Lab 02/27/16 0612 02/28/16 0411  NA 134* 137  K 4.0 4.1  CL 100* 106  CO2 21* 26  GLUCOSE 111* 184*  BUN 7 10  CREATININE 0.71 0.71  CALCIUM 8.8* 8.7*   ------------------------------------------------------------------------------------------------------------------ estimated creatinine clearance is 108.2 mL/min (by C-G formula based on Cr of 0.71). ------------------------------------------------------------------------------------------------------------------  Recent Labs  02/27/16 0612  HGBA1C 5.3   ------------------------------------------------------------------------------------------------------------------ No results for input(s): CHOL, HDL, LDLCALC, TRIG, CHOLHDL, LDLDIRECT in the last 72 hours. ------------------------------------------------------------------------------------------------------------------  Recent Labs  02/27/16 0612  TSH 5.004*    ------------------------------------------------------------------------------------------------------------------ No results for input(s): VITAMINB12, FOLATE, FERRITIN, TIBC, IRON, RETICCTPCT in the last 72 hours.  Coagulation profile No results for input(s): INR, PROTIME in the last 168 hours.  No results for input(s): DDIMER in the last 72 hours.  Cardiac Enzymes No results for input(s): CKMB, TROPONINI, MYOGLOBIN in the last 168 hours.  Invalid input(s): CK ------------------------------------------------------------------------------------------------------------------ Invalid input(s): POCBNP    Assessment & Plan   This is a 45 year old female with COPD admitted for acute on chronic respiratory failure with hypoxia. 1. Acute on chronic respiratory failure:acute on chronic on copd flare continue IV solumedrol,  Continue symbicort, slow to improve: We'll wean oxygen as tolerated 2. Acute on chronic COPD exasperation: Continue current therapy with inhalers still significant wheezing and coughing continue IV steroid 3. Sepsis: Due to acute bronchitis chest x-ray negative, influenza A and B negative needs PCR, continue levaquin 4. Depression: Stable; continue fluoxetine 5. Hepatitis C: currently not undergoing treatment outpatient follow-up with GI 6. Chronic pain: Continue Subutex 7. Congestive heart failure: Type unknown currently compensated no evidence of exasperation 8. DVT prophylaxis: Continue Lovenox 9. GI prophylaxis: None     Code Status Orders        Start     Ordered   02/27/16 0835  Full code   Continuous     02/27/16 0834    Code Status History    Date Active Date Inactive Code Status Order ID Comments User Context   This patient has a current code status but no historical code status.           Consults  none DVT Prophylaxis  Lovenox  Lab Results  Component Value  Date   PLT 266 02/28/2016     Time Spent in minutes    Auburn Bilberry M.D on 02/29/2016 at 12:02 PM  Between 7am to 6pm - Pager - [phone]  After 6pm go to [email or web address] - password [email or web address]  [facility] [place] Hospital[facility]   Office  [phone]

## 2016-02-29 NOTE — Clinical Documentation Improvement (Signed)
Hospitalist  Would you specify clinical indicators for diagnosis of sepsis with bronchitis?    Supporting Information: Pt admitting vital signs - BP 122/71, Pulse - 92, resp - 22, temp - 98.8; WBC - 6.1; No Lactic acid was drawn.  CXR states Chronic bronchitic changes.  Please exercise your independent, professional judgment when responding. A specific answer is not anticipated or expected.   Thank Modesta MessingYou,  Elizah Lydon L [facility] [place] [phone]

## 2016-02-29 NOTE — Plan of Care (Signed)
Problem: Education: Goal: Knowledge of [facility] Education information/materials will improve Outcome: Progressing Pt given smoking cessation education materials at this time. Encouraged to ask questions if needed.

## 2016-02-29 NOTE — Care Management (Signed)
[facility] with the diagnosis of acute/chronic repiratory failure. Lives with husband, Margarita GrizzleCharles Sheets [phone] or [phone]). [facility] of last week. Takes care of all basic and instrumental activities of daily living herself, drives. No falls. Appetite is "So-so." Uses no aids for ambulation. No home oxygen. Last seizure activity was 3 years ago. Disability x 17 years. Husband will transport. Gwenette GreetBrenda S Georgiana Spillane RN MSN [facility] Care Management [phone]

## 2016-03-01 MED ORDER — MAGIC MOUTHWASH
10.0000 mL | Freq: Three times a day (TID) | ORAL | Status: DC
  Administered 2016-03-01 – 2016-03-02 (×3): 10 mL via ORAL
  Filled 2016-03-01 (×5): qty 10

## 2016-03-01 MED ORDER — PREDNISONE 20 MG PO TABS
50.0000 mg | ORAL_TABLET | Freq: Every day | ORAL | Status: DC
  Administered 2016-03-02: 10:00:00 50 mg via ORAL
  Filled 2016-03-01: qty 2

## 2016-03-01 NOTE — Progress Notes (Signed)
[facility] - [place] at [facility]                                                                                                                                                                                            Patient Demographics   Denise Valentine, is a 45 y.o. female, DOB - 1971/11/07, ZOX:[id number]  Admit date - 02/27/2016   Admitting Physician Arnaldo Natal, MD  Outpatient Primary MD for the patient is SALVADOR,VIVIAN, DO   LOS - 3  Subjective: Has aches in multiple parts of the body. Breathing is continuing to improve complains of sore throat no chest pain Review of Systems:   CONSTITUTIONAL: No documented fever. No fatigue, weakness. No weight gain, no weight loss.  EYES: No blurry or double vision.  ENT: No tinnitus. No postnasal drip. No redness of the oropharynx.  RESPIRATORY:  postive cough, positive wheeze, no hemoptysis.  postive dyspnea.  CARDIOVASCULAR: No chest pain. No orthopnea. No palpitations. No syncope.  GASTROINTESTINAL: No nausea, no vomiting or diarrhea. No abdominal pain. No melena or hematochezia.  GENITOURINARY: No dysuria or hematuria.  ENDOCRINE: No polyuria or nocturia. No heat or cold intolerance.  HEMATOLOGY: No anemia. No bruising. No bleeding.  INTEGUMENTARY: No rashes. No lesions.  MUSCULOSKELETAL: No arthritis. No swelling. No gout.  NEUROLOGIC: No numbness, tingling, or ataxia. No seizure-type activity.  PSYCHIATRIC: No anxiety. No insomnia. No ADD.    Vitals:   Filed Vitals:   03/01/16 0500 03/01/16 0536 03/01/16 0835 03/01/16 1115  BP:  128/72    Pulse:  69    Temp:  98.5 F (36.9 C)    TempSrc:  Oral    Resp:  20    Height:      Weight: 97.977 kg (216 lb)     SpO2:  95% 94% 96%    Wt Readings from Last 3 Encounters:  03/01/16 97.977 kg (216 lb)  01/02/16 97.523 kg (215 lb)  12/27/15 93.895 kg (207 lb)    No intake or output data in the 24 hours ending 03/01/16 1313  Physical Exam:    GENERAL: Pleasant-appearing in no apparent distress.  HEAD, EYES, EARS, NOSE AND THROAT: Atraumatic, normocephalic. Extraocular muscles are intact. Pupils equal and reactive to light. Sclerae anicteric. No conjunctival injection. No oro-pharyngeal erythema.  NECK: Supple. There is no jugular venous distention. No bruits, no lymphadenopathy, no thyromegaly.  HEART: Regular rate and rhythm,. No murmurs, no rubs, no clicks.  LUNGS: Diminished breath sounds without any wheezing or rhonchi ABDOMEN: Soft, flat, nontender, nondistended. Has good bowel sounds. No hepatosplenomegaly appreciated.  EXTREMITIES: No evidence of  any cyanosis, clubbing, or peripheral edema.  +2 pedal and radial pulses bilaterally.  NEUROLOGIC: The patient is alert, awake, and oriented x3 with no focal motor or sensory deficits appreciated bilaterally.  SKIN: Moist and warm with no rashes appreciated.  Psych: Not anxious, depressed LN: No inguinal LN enlargement    Antibiotics   Anti-infectives    Start     Dose/Rate Route Frequency Ordered Stop   02/29/16 1000  levofloxacin (LEVAQUIN) tablet 500 mg     500 mg Oral Daily 02/28/16 1029     02/28/16 1000  azithromycin (ZITHROMAX) tablet 250 mg  Status:  Discontinued     250 mg Oral Daily 02/27/16 0834 02/27/16 0858   02/28/16 1000  levofloxacin (LEVAQUIN) IVPB 500 mg  Status:  Discontinued     500 mg 100 mL/hr over 60 Minutes Intravenous Every 24 hours 02/27/16 0901 02/28/16 1029   02/27/16 0915  levofloxacin (LEVAQUIN) IVPB 500 mg     500 mg 100 mL/hr over 60 Minutes Intravenous  Once 02/27/16 0901 02/27/16 1229   02/27/16 0800  levofloxacin (LEVAQUIN) IVPB 500 mg  Status:  Discontinued     500 mg 100 mL/hr over 60 Minutes Intravenous Every 24 hours 02/27/16 0748 02/27/16 0901   02/27/16 0700  azithromycin (ZITHROMAX) 500 mg in dextrose 5 % 250 mL IVPB     500 mg 250 mL/hr over 60 Minutes Intravenous  Once 02/27/16 0654 02/27/16 0738   02/27/16 0000  azithromycin  (ZITHROMAX Z-PAK) 250 MG tablet        02/27/16 0638 03/03/16 2359      Medications   Scheduled Meds: . acetylcysteine  4 mL Nebulization BID  . antiseptic oral rinse  7 mL Mouth Rinse BID  . budesonide-formoterol  2 puff Inhalation BID  . buprenorphine  8 mg Sublingual TID  . docusate sodium  100 mg Oral BID  . enoxaparin (LOVENOX) injection  40 mg Subcutaneous Q24H  . ferrous sulfate  325 mg Oral Q breakfast  . FLUoxetine  40 mg Oral Daily  . furosemide  20 mg Oral Daily  . gabapentin  300 mg Oral TID  . guaiFENesin  600 mg Oral BID  . ipratropium-albuterol  3 mL Nebulization Q6H  . levofloxacin  500 mg Oral Daily  . methylPREDNISolone (SOLU-MEDROL) injection  60 mg Intravenous Q6H  . nicotine  21 mg Transdermal Daily  . pantoprazole  40 mg Oral Daily  . polyethylene glycol  17 g Oral Daily  . senna  1 tablet Oral Daily  . sodium chloride flush  3 mL Intravenous Q12H  . spironolactone  25 mg Oral Daily  . tiotropium  18 mcg Inhalation Daily  . traZODone  100 mg Oral QHS   Continuous Infusions:  PRN Meds:.acetaminophen **OR** acetaminophen, guaiFENesin-codeine, ibuprofen, ondansetron **OR** ondansetron (ZOFRAN) IV   Data Review:   Micro Results Recent Results (from the past 240 hour(s))  Rapid Influenza A&B Antigens ([facility] only)     Status: None   Collection Time: 02/27/16  6:12 AM  Result Value Ref Range Status   Influenza A ([facility]) NEGATIVE NEGATIVE Final   Influenza B ([facility]) NEGATIVE NEGATIVE Final  Blood culture (routine x 2)     Status: None (Preliminary result)   Collection Time: 02/27/16  7:14 AM  Result Value Ref Range Status   Specimen Description BLOOD RIGHT WRIST  Final   Special Requests BOTTLES DRAWN AEROBIC AND ANAEROBIC 5CCAERO,2CCANA  Final   Culture NO GROWTH 3 DAYS  Final  Report Status PENDING  Incomplete  Blood culture (routine x 2)     Status: None (Preliminary result)   Collection Time: 02/27/16  7:14 AM  Result Value Ref Range Status    Specimen Description BLOOD LEFT WRIST  Final   Special Requests BOTTLES DRAWN AEROBIC AND ANAEROBIC 5CCAERO,1CCANA  Final   Culture NO GROWTH 3 DAYS  Final   Report Status PENDING  Incomplete    Radiology Reports Dg Chest 2 View  02/27/2016  CLINICAL DATA:  Shortness of breath EXAM: CHEST  2 VIEW COMPARISON:  01/03/2016 FINDINGS: Chronic interstitial coarsening. There is no edema, consolidation, effusion, or pneumothorax. Normal heart size and aortic contours. IMPRESSION: Chronic bronchitic changes.  No pneumonia or edema. Electronically Signed   By: Marnee SpringJonathon  Watts M.D.   On: 02/27/2016 07:07     CBC  Recent Labs Lab 02/27/16 0612 02/28/16 0411  WBC 6.1 5.5  HGB 12.2 12.4  HCT 37.8 37.8  PLT 238 266  MCV 73.8* 73.8*  MCH 23.9* 24.2*  MCHC 32.3 32.7  RDW 20.5* 20.3*  LYMPHSABS 1.4  --   MONOABS 0.8  --   EOSABS 0.1  --   BASOSABS 0.0  --     Chemistries   Recent Labs Lab 02/27/16 0612 02/28/16 0411  NA 134* 137  K 4.0 4.1  CL 100* 106  CO2 21* 26  GLUCOSE 111* 184*  BUN 7 10  CREATININE 0.71 0.71  CALCIUM 8.8* 8.7*   ------------------------------------------------------------------------------------------------------------------ estimated creatinine clearance is 108 mL/min (by C-G formula based on Cr of 0.71). ------------------------------------------------------------------------------------------------------------------ No results for input(s): HGBA1C in the last 72 hours. ------------------------------------------------------------------------------------------------------------------ No results for input(s): CHOL, HDL, LDLCALC, TRIG, CHOLHDL, LDLDIRECT in the last 72 hours. ------------------------------------------------------------------------------------------------------------------ No results for input(s): TSH, T4TOTAL, T3FREE, THYROIDAB in the last 72 hours.  Invalid input(s):  FREET3 ------------------------------------------------------------------------------------------------------------------ No results for input(s): VITAMINB12, FOLATE, FERRITIN, TIBC, IRON, RETICCTPCT in the last 72 hours.  Coagulation profile No results for input(s): INR, PROTIME in the last 168 hours.  No results for input(s): DDIMER in the last 72 hours.  Cardiac Enzymes No results for input(s): CKMB, TROPONINI, MYOGLOBIN in the last 168 hours.  Invalid input(s): CK ------------------------------------------------------------------------------------------------------------------ Invalid input(s): POCBNP    Assessment & Plan   This is a 45 year old female with COPD admitted for acute on chronic respiratory failure with hypoxia. 1. Acute on chronic respiratory failure:acute on chronic on copd flare prednisone taper,  Continue symbicort, wean oxygen 2. Acute on chronic COPD exasperation: Continue current therapy with inhalers still significant wheezing and coughing continue IV steroid 3. Sepsis: Due to acute bronchitis based on patient's heart rate of 102 documented by admitting physician's H&P, respiratory rate of 26. Positive influenza B patients have symptoms have going on for more than 48 hours supportive care 4. Depression: Stable; continue fluoxetine 5. Hepatitis C: currently not undergoing treatment outpatient follow-up with GI 6. Chronic pain: Continue Subutex 7. Congestive heart failure: Type unknown currently compensated no evidence of exasperation 8. DVT prophylaxis: Continue Lovenox 9. GI prophylaxis: None     Code Status Orders        Start     Ordered   02/27/16 0835  Full code   Continuous     02/27/16 0834    Code Status History    Date Active Date Inactive Code Status Order ID Comments User Context   This patient has a current code status but no historical code status.       Anticipate discharge tomorrow  Consults  none DVT Prophylaxis   Lovenox  Lab Results  Component Value Date   PLT 266 02/28/2016     Time Spent in minutes    Auburn Bilberry M.D on 03/01/2016 at 1:13 PM  Between 7am to 6pm - Pager - [phone]  After 6pm go to [email or web address] - password [email or web address] [facility]  [facility]   Office  [phone]

## 2016-03-02 LAB — PROCALCITONIN

## 2016-03-02 MED ORDER — GUAIFENESIN-CODEINE 100-10 MG/5ML PO SOLN: 10.0000 mL | ORAL | Status: DC | PRN

## 2016-03-02 MED ORDER — LEVOFLOXACIN 500 MG PO TABS: 500.0000 mg | ORAL_TABLET | Freq: Every day | ORAL | Status: AC

## 2016-03-02 MED ORDER — GUAIFENESIN ER 600 MG PO TB12
600.0000 mg | ORAL_TABLET | Freq: Two times a day (BID) | ORAL | Status: DC
Start: 2016-03-02 — End: 2020-12-08

## 2016-03-02 MED ORDER — PREDNISONE 10 MG (21) PO TBPK: 10.0000 mg | ORAL_TABLET | Freq: Every day | ORAL | Status: DC

## 2016-03-02 NOTE — Progress Notes (Signed)
Patient can resume her home medication today as she is being discharged from hospital. She did receive codeine -containing cough syrup.

## 2016-03-02 NOTE — Progress Notes (Signed)
Patient declined enrolling into [facility] to home program on 3/14.   Demetrius Charityeldrin D. Jody Silas, PharmD, BCPS Clinical Pharmacist.

## 2016-03-02 NOTE — Discharge Summary (Signed)
Denise Valentine, 45 y.o., DOB Dec 24, 1970, MRN [id number]. Admission date: 02/27/2016 Discharge Date 03/02/2016 Primary MD Johny Drilling, DO Admitting Physician Arnaldo Natal, MD  Admission Diagnosis  COPD exacerbation [facility]) [J44.1]  Discharge Diagnosis   Active Problems:  Acute on chronic respiratory failure with hypoxia (HCC) Influenza B Sepsis ruled out Acute bronchitis Depression Hepatitis c Chronic pain syndrome       Hospital CourseThe patient presents emergency department complaining of shortness of breath and coughing. Patient husband was also sick at home with similar type illness. She was admitted with acute COPD exasperation. She was treated with nebulizers steroids and antibiotics. With improvement in her symptoms. Influenza was checked and she was positive for influenza B but her symptoms were greater than 48 hours therefore no anti vitals were started. She is feeling much better her breathing is improved. Coughing persist but she is off oxygen now. She is not not having any fevers.            Consults  None  Significant Tests:  See full reports for all details    Dg Chest 2 View  02/27/2016  CLINICAL DATA:  Shortness of breath EXAM: CHEST  2 VIEW COMPARISON:  01/03/2016 FINDINGS: Chronic interstitial coarsening. There is no edema, consolidation, effusion, or pneumothorax. Normal heart size and aortic contours. IMPRESSION: Chronic bronchitic changes.  No pneumonia or edema. Electronically Signed   By: Marnee Spring M.D.   On: 02/27/2016 07:07       Today   Subjective:   Denise Valentine  feeling much better    Objective:   Blood pressure 101/51, pulse 64, temperature 98.8 F (37.1 C), temperature source Oral, resp. rate 18, height  (1.702 m), weight 96.163 kg (212 lb), last menstrual period 02/21/2016, SpO2 93 %.  . No intake or output data in the 24 hours ending 03/02/16 1344  Exam VITAL SIGNS: Blood pressure 101/51, pulse 64, temperature 98.8 F  (37.1 C), temperature source Oral, resp. rate 18, height  (1.702 m), weight 96.163 kg (212 lb), last menstrual period 02/21/2016, SpO2 93 %.  GENERAL:  45 y.o.-year-old patient lying in the bed with no acute distress.  EYES: Pupils equal, round, reactive to light and accommodation. No scleral icterus. Extraocular muscles intact.  HEENT: Head atraumatic, normocephalic. Oropharynx and nasopharynx clear.  NECK:  Supple, no jugular venous distention. No thyroid enlargement, no tenderness.  LUNGS: Normal breath sounds bilaterally, no wheezing, rales,rhonchi or crepitation. No use of accessory muscles of respiration.  CARDIOVASCULAR: S1, S2 normal. No murmurs, rubs, or gallops.  ABDOMEN: Soft, nontender, nondistended. Bowel sounds present. No organomegaly or mass.  EXTREMITIES: No pedal edema, cyanosis, or clubbing.  NEUROLOGIC: Cranial nerves II through XII are intact. Muscle strength 5/5 in all extremities. Sensation intact. Gait not checked.  PSYCHIATRIC: The patient is alert and oriented x 3.  SKIN: No obvious rash, lesion, or ulcer.   Data Review     CBC w Diff:  Lab Results  Component Value Date   WBC 5.5 02/28/2016   HGB 12.4 02/28/2016   HCT 37.8 02/28/2016   PLT 266 02/28/2016   LYMPHOPCT 22 02/27/2016   MONOPCT 13 02/27/2016   EOSPCT 1 02/27/2016   BASOPCT 1 02/27/2016   CMP:  Lab Results  Component Value Date   NA 137 02/28/2016   K 4.1 02/28/2016   CL 106 02/28/2016   CO2 26 02/28/2016   BUN 10 02/28/2016   CREATININE 0.71 02/28/2016   PROT 6.9 01/01/2016  ALBUMIN 3.3* 01/01/2016   BILITOT 0.4 01/01/2016   ALKPHOS 69 01/01/2016   AST 22 01/01/2016   ALT 14 01/01/2016  .  Micro Results Recent Results (from the past 240 hour(s))  Rapid Influenza A&B Antigens ([facility] only)     Status: None   Collection Time: 02/27/16  6:12 AM  Result Value Ref Range Status   Influenza A ([facility]) NEGATIVE NEGATIVE Final   Influenza B ([facility]) NEGATIVE NEGATIVE Final  Blood  culture (routine x 2)     Status: None (Preliminary result)   Collection Time: 02/27/16  7:14 AM  Result Value Ref Range Status   Specimen Description BLOOD RIGHT WRIST  Final   Special Requests BOTTLES DRAWN AEROBIC AND ANAEROBIC 5CCAERO,2CCANA  Final   Culture NO GROWTH 4 DAYS  Final   Report Status PENDING  Incomplete  Blood culture (routine x 2)     Status: None (Preliminary result)   Collection Time: 02/27/16  7:14 AM  Result Value Ref Range Status   Specimen Description BLOOD LEFT WRIST  Final   Special Requests BOTTLES DRAWN AEROBIC AND ANAEROBIC 5CCAERO,1CCANA  Final   Culture NO GROWTH 4 DAYS  Final   Report Status PENDING  Incomplete        Code Status Orders        Start     Ordered   02/27/16 0835  Full code   Continuous     02/27/16 0834    Code Status History    Date Active Date Inactive Code Status Order ID Comments User Context   This patient has a current code status but no historical code status.          Follow-up Information    Follow up with SALVADOR,VIVIAN, DO In 7 days.   Specialty:  Pediatrics   Contact information:   [address]  [place]  [place] [address] [phone]       Discharge Medications     Medication List    STOP taking these medications        albuterol 1.25 MG/3ML nebulizer solution  Commonly known as:  ACCUNEB  Replaced by:  albuterol 108 (90 Base) MCG/ACT inhaler  You also have another medication with the same name that you need to continue taking as instructed.      TAKE these medications        ADVAIR DISKUS 250-50 MCG/DOSE Aepb  Generic drug:  Fluticasone-Salmeterol  Inhale 1 puff into the lungs 2 (two) times daily.     albuterol (2.5 MG/3ML) 0.083% nebulizer solution  Commonly known as:  PROVENTIL  Inhale 3 mLs into the lungs every 6 (six) hours as needed.     albuterol 108 (90 Base) MCG/ACT inhaler  Commonly known as:  PROVENTIL HFA;VENTOLIN HFA  Inhale 2 puffs into the lungs every 6 (six)  hours as needed for wheezing or shortness of breath.     budesonide-formoterol 160-4.5 MCG/ACT inhaler  Commonly known as:  SYMBICORT  Inhale 2 puffs into the lungs 2 (two) times daily.     buprenorphine-naloxone 8-2 MG Subl SL tablet  Commonly known as:  SUBOXONE  Place 1 tablet under the tongue 3 (three) times daily.     ferrous sulfate 160 (50 Fe) MG Tbcr SR tablet  Commonly known as:  EQL SLOW RELEASE IRON  Take 1 tablet (160 mg total) by mouth daily.     FLUoxetine 40 MG capsule  Commonly known as:  PROZAC  Take 40 mg  by mouth daily.     furosemide 20 MG tablet  Commonly known as:  LASIX  Take 1 tablet (20 mg total) by mouth daily.     gabapentin 300 MG capsule  Commonly known as:  NEURONTIN  Take 300 mg by mouth 3 (three) times daily.     guaiFENesin 600 MG 12 hr tablet  Commonly known as:  MUCINEX  Take 1 tablet (600 mg total) by mouth 2 (two) times daily.     guaiFENesin-codeine 100-10 MG/5ML syrup  Take 10 mLs by mouth every 4 (four) hours as needed for cough.     ibuprofen 800 MG tablet  Commonly known as:  ADVIL,MOTRIN  Take 1 tablet (800 mg total) by mouth every 8 (eight) hours as needed for moderate pain.     levofloxacin 500 MG tablet  Commonly known as:  LEVAQUIN  Take 1 tablet (500 mg total) by mouth daily.     pantoprazole 40 MG tablet  Commonly known as:  PROTONIX  Take 40 mg by mouth daily.     predniSONE 10 MG (21) Tbpk tablet  Commonly known as:  STERAPRED UNI-PAK 21 TAB  Take 1 tablet (10 mg total) by mouth daily.     spironolactone 25 MG tablet  Commonly known as:  ALDACTONE  Take 25 mg by mouth daily.     traZODone 100 MG tablet  Commonly known as:  DESYREL  Take 100 mg by mouth at bedtime.           Total Time in preparing paper work, data evaluation and todays exam - 35 minutes  Auburn BilberryPATEL, Avel Ogawa M.D on 03/02/2016 at 1:44 PM  [facility]   Office  [phone]

## 2016-03-02 NOTE — Discharge Instructions (Signed)

## 2016-03-02 NOTE — Progress Notes (Signed)
Discharge instructions given and went over with patient at bedside. All questions answered. Prescriptions given. Patient discharged home with husband via wheelchair by nursing staff. Bo McclintockBrewer,Finley Dinkel S, RN

## 2016-03-03 LAB — CULTURE, BLOOD (ROUTINE X 2)
CULTURE: NO GROWTH
CULTURE: NO GROWTH

## 2016-04-24 ENCOUNTER — Emergency Department
Admission: EM | Admit: 2016-04-24 | Discharge: 2016-04-24 | Disposition: A | Discharge status: Home / Self Care | Payer: Medicaid Other | Attending: Emergency Medicine | Admitting: Emergency Medicine

## 2016-04-24 ENCOUNTER — Emergency Department: Payer: Medicaid Other

## 2016-04-24 ENCOUNTER — Encounter: Payer: Self-pay | Admitting: Emergency Medicine

## 2016-04-24 DIAGNOSIS — F329 Major depressive disorder, single episode, unspecified: Secondary | ICD-10-CM | POA: Insufficient documentation

## 2016-04-24 DIAGNOSIS — Z791 Long term (current) use of non-steroidal anti-inflammatories (NSAID): Secondary | ICD-10-CM | POA: Diagnosis not present

## 2016-04-24 DIAGNOSIS — F17210 Nicotine dependence, cigarettes, uncomplicated: Secondary | ICD-10-CM | POA: Insufficient documentation

## 2016-04-24 DIAGNOSIS — J449 Chronic obstructive pulmonary disease, unspecified: Secondary | ICD-10-CM | POA: Diagnosis not present

## 2016-04-24 DIAGNOSIS — Z79899 Other long term (current) drug therapy: Secondary | ICD-10-CM | POA: Diagnosis not present

## 2016-04-24 DIAGNOSIS — R0602 Shortness of breath: Secondary | ICD-10-CM | POA: Insufficient documentation

## 2016-04-24 DIAGNOSIS — J45909 Unspecified asthma, uncomplicated: Secondary | ICD-10-CM | POA: Insufficient documentation

## 2016-04-24 DIAGNOSIS — R0789 Other chest pain: Secondary | ICD-10-CM | POA: Diagnosis present

## 2016-04-24 LAB — TROPONIN I

## 2016-04-24 LAB — COMPREHENSIVE METABOLIC PANEL
ALBUMIN: 3.6 g/dL (ref 3.5–5.0)
ALT: 27 U/L (ref 14–54)
AST: 24 U/L (ref 15–41)
Alkaline Phosphatase: 72 U/L (ref 38–126)
Anion gap: 8 (ref 5–15)
BUN: 7 mg/dL (ref 6–20)
CHLORIDE: 101 mmol/L (ref 101–111)
CO2: 27 mmol/L (ref 22–32)
CREATININE: 0.78 mg/dL (ref 0.44–1.00)
Calcium: 8.7 mg/dL — ABNORMAL LOW (ref 8.9–10.3)
GFR calc Af Amer: >60 mL/min (ref >60)
GFR calc non Af Amer: >60 mL/min (ref >60)
GLUCOSE: 113 mg/dL — AB (ref 65–99)
Potassium: 3.7 mmol/L (ref 3.5–5.1)
SODIUM: 136 mmol/L (ref 135–145)
Total Bilirubin: 0.3 mg/dL (ref 0.3–1.2)
Total Protein: 7 g/dL (ref 6.5–8.1)

## 2016-04-24 LAB — CBC WITH DIFFERENTIAL/PLATELET
BASOS PCT: 1 %
Basophils Absolute: 0.1 10*3/uL (ref 0–0.1)
EOS ABS: 0.1 10*3/uL (ref 0–0.7)
Eosinophils Relative: 2 %
HCT: 42.1 % (ref 35.0–47.0)
HEMOGLOBIN: 13.7 g/dL (ref 12.0–16.0)
LYMPHS ABS: 2.5 10*3/uL (ref 1.0–3.6)
Lymphocytes Relative: 33 %
MCH: 25.7 pg — AB (ref 26.0–34.0)
MCHC: 32.5 g/dL (ref 32.0–36.0)
MCV: 79.2 fL — ABNORMAL LOW (ref 80.0–100.0)
Monocytes Absolute: 0.5 10*3/uL (ref 0.2–0.9)
Monocytes Relative: 7 %
NEUTROS PCT: 57 %
Neutro Abs: 4.2 10*3/uL (ref 1.4–6.5)
Platelets: 280 10*3/uL (ref 150–440)
RBC: 5.32 MIL/uL — AB (ref 3.80–5.20)
RDW: 16.4 % — ABNORMAL HIGH (ref 11.5–14.5)
WBC: 7.4 10*3/uL (ref 3.6–11.0)

## 2016-04-24 LAB — FIBRIN DERIVATIVES D-DIMER (ARMC ONLY): Fibrin derivatives D-dimer (ARMC): 701 — ABNORMAL HIGH (ref 0–499)

## 2016-04-24 MED ORDER — KETOROLAC TROMETHAMINE 30 MG/ML IJ SOLN
10.0000 mg | Freq: Once | INTRAMUSCULAR | Status: AC
  Administered 2016-04-24: 9.9 mg via INTRAVENOUS
  Filled 2016-04-24: qty 1

## 2016-04-24 MED ORDER — ONDANSETRON HCL 4 MG/2ML IJ SOLN
4.0000 mg | INTRAMUSCULAR | Status: AC
  Administered 2016-04-24: 4 mg via INTRAVENOUS
  Filled 2016-04-24: qty 2

## 2016-04-24 MED ORDER — MORPHINE SULFATE (PF) 4 MG/ML IV SOLN
4.0000 mg | Freq: Once | INTRAVENOUS | Status: AC
  Administered 2016-04-24: 4 mg via INTRAVENOUS
  Filled 2016-04-24: qty 1

## 2016-04-24 MED ORDER — IOPAMIDOL (ISOVUE-370) INJECTION 76%
75.0000 mL | Freq: Once | INTRAVENOUS | Status: AC | PRN
  Administered 2016-04-24: 75 mL via INTRAVENOUS

## 2016-04-24 NOTE — Discharge Instructions (Signed)
You have been seen in the Emergency Department (ED) today for chest pain.  As we have discussed todays test results are normal, and we believe your pain is due to pain/strain and/or inflammation of the muscles and/or cartilage of your chest wall.  We recommend you take ibuprofen 600 mg three times a day with meals for the next 5 days (unless you have been told previously not to take ibuprofen or NSAIDs in general).  You may also take Tylenol according to the label instructions.  Read through the included information for additional treatment recommendations and precautions.  Continue to take your regular medications including your Suboxone.  Return to the Emergency Department (ED) if you experience any further chest pain/pressure/tightness, difficulty breathing, or sudden sweating, or other symptoms that concern you.    Chest Wall Pain Chest wall pain is pain in or around the bones and muscles of your chest. Sometimes, an injury causes this pain. Sometimes, the cause may not be known. This pain may take several weeks or longer to get better. HOME CARE INSTRUCTIONS  Pay attention to any changes in your symptoms. Take these actions to help with your pain:   Rest as told by your health care provider.   Avoid activities that cause pain. These include any activities that use your chest muscles or your abdominal and side muscles to lift heavy items.   If directed, apply ice to the painful area:  Put ice in a plastic bag.  Place a towel between your skin and the bag.  Leave the ice on for 20 minutes, 2-3 times per day.  Take over-the-counter and prescription medicines only as told by your health care provider.  Do not use tobacco products, including cigarettes, chewing tobacco, and e-cigarettes. If you need help quitting, ask your health care provider.  Keep all follow-up visits as told by your health care provider. This is important. SEEK MEDICAL CARE IF:  You have a fever.  Your chest  pain becomes worse.  You have new symptoms. SEEK IMMEDIATE MEDICAL CARE IF:  You have nausea or vomiting.  You feel sweaty or light-headed.  You have a cough with phlegm (sputum) or you cough up blood.  You develop shortness of breath.   This information is not intended to replace advice given to you by your health care provider. Make sure you discuss any questions you have with your health care provider.   Document Released: 12/04/2005 Document Revised: 08/25/2015 Document Reviewed: 03/01/2015 Elsevier Interactive Patient Education [employer]2016 [employer] Inc.

## 2016-04-24 NOTE — ED Notes (Signed)
C/o sob and pain in right ribs with deep breath.

## 2016-04-24 NOTE — ED Provider Notes (Signed)
[facility] Emergency Department Provider Note  ____________________________________________  Time seen: Approximately 5:43 PM  I have reviewed the triage vital signs and the nursing notes.   HISTORY  Chief Complaint Shortness of Breath    HPI Denise Valentine is a 45 y.o. female with frequent emergency department visits related to her severe COPD and asthma.  She presents today with complaints of acute onset of right-sided chest wall pain.  She reports that she awoke with this pain this morning and has no recent history of trauma.  She states that any normal or deep breath causes severe sharp stabbing pain primarily in the right side of her ribs.  She moans and cries out every now and then as if she is having a cramp.  She reports shortness of breath as a result of the inability to take a deep breath.She denies fever/chills, abdominal pain, nausea, vomiting, diarrhea, dysuria.  She denies ever having feelings like this before.  Nothing makes it better and any amount of movement or deep inspiration makes it worse.   Past Medical History  Diagnosis Date  . Depression   . Anxiety   . Seizures (HCC)   . GERD (gastroesophageal reflux disease)   . Hepatitis C   . Ovarian cyst   . Asthma   . COPD (chronic obstructive pulmonary disease) [facility])     Patient Active Problem List   Diagnosis Date Noted  . Acute on chronic respiratory failure with hypoxia (HCC) 02/27/2016    Past Surgical History  Procedure Laterality Date  . Back surgery    . Tubal ligation    . Tonsillectomy    . Cholecystectomy    . Wisdom tooth extraction      Current Outpatient Rx  Name  Route  Sig  Dispense  Refill  . ADVAIR DISKUS 250-50 MCG/DOSE AEPB   Inhalation   Inhale 1 puff into the lungs 2 (two) times daily.      0     Dispense as written.   Marland Kitchen albuterol (PROVENTIL HFA;VENTOLIN HFA) 108 (90 Base) MCG/ACT inhaler   Inhalation   Inhale 2 puffs into the lungs every 6 (six)  hours as needed for wheezing or shortness of breath.   1 Inhaler   0   . albuterol (PROVENTIL) (2.5 MG/3ML) 0.083% nebulizer solution   Inhalation   Inhale 3 mLs into the lungs every 6 (six) hours as needed.      1   . budesonide-formoterol (SYMBICORT) 160-4.5 MCG/ACT inhaler   Inhalation   Inhale 2 puffs into the lungs 2 (two) times daily.         . buprenorphine-naloxone (SUBOXONE) 8-2 MG SUBL SL tablet   Sublingual   Place 1 tablet under the tongue 3 (three) times daily.         . ferrous sulfate (EQL SLOW RELEASE IRON) 160 (50 Fe) MG TBCR SR tablet   Oral   Take 1 tablet (160 mg total) by mouth daily.   30 each   6   . FLUoxetine (PROZAC) 40 MG capsule   Oral   Take 40 mg by mouth daily.         . furosemide (LASIX) 20 MG tablet   Oral   Take 1 tablet (20 mg total) by mouth daily.   30 tablet   11   . gabapentin (NEURONTIN) 300 MG capsule   Oral   Take 300 mg by mouth 3 (three) times daily.         Marland Kitchen  guaiFENesin (MUCINEX) 600 MG 12 hr tablet   Oral   Take 1 tablet (600 mg total) by mouth 2 (two) times daily.   14 tablet   0   . guaiFENesin-codeine 100-10 MG/5ML syrup   Oral   Take 10 mLs by mouth every 4 (four) hours as needed for cough.   120 mL   0   . ibuprofen (ADVIL,MOTRIN) 800 MG tablet   Oral   Take 1 tablet (800 mg total) by mouth every 8 (eight) hours as needed for moderate pain.   15 tablet   0   . pantoprazole (PROTONIX) 40 MG tablet   Oral   Take 40 mg by mouth daily.         . predniSONE (STERAPRED UNI-PAK 21 TAB) 10 MG (21) TBPK tablet   Oral   Take 1 tablet (10 mg total) by mouth daily.   21 tablet   0     Start with  taper by  until complete   . spironolactone (ALDACTONE) 25 MG tablet   Oral   Take 25 mg by mouth daily.         . traZODone (DESYREL) 100 MG tablet   Oral   Take 100 mg by mouth at bedtime.           Allergies Erythromycin  Family History  Problem Relation Age of Onset  . CAD  Mother     Social History Social History  Substance Use Topics  . Smoking status: Current Every Day Smoker -- 5.00 packs/day    Types: Cigarettes  . Smokeless tobacco: None  . Alcohol Use: No    Review of Systems Constitutional: No fever/chills Eyes: No visual changes. ENT: No sore throat. Cardiovascular: Right-sided chest wall pain with inspiration Respiratory: +shortness of breath and painful inspiration Gastrointestinal: No abdominal pain.  No nausea, no vomiting.  No diarrhea.  No constipation. Genitourinary: Negative for dysuria. Musculoskeletal: Negative for back pain. Skin: Negative for rash. Neurological: Negative for headaches, focal weakness or numbness.  10-point ROS otherwise negative.  ____________________________________________   PHYSICAL EXAM:  VITAL SIGNS: ED Triage Vitals  Enc Vitals Group     BP 04/24/16 1715 144/105 mmHg     Pulse Rate 04/24/16 1715 70     Resp 04/24/16 1715 30     Temp 04/24/16 1715 98.3 F (36.8 C)     Temp Source 04/24/16 1715 Oral     SpO2 04/24/16 1715 96 %     Weight 04/24/16 1715 205 lb (92.987 kg)     Height 04/24/16 1715  (1.702 m)     Head Cir --      Peak Flow --      Pain Score 04/24/16 1716 10     Pain Loc --      Pain Edu? --      Excl. in GC? --     Constitutional: Alert and oriented. Mild to moderate respiratory distress, appears uncomfortable Eyes: Conjunctivae are normal. PERRL. EOMI. Head: Atraumatic. Nose: No congestion/rhinnorhea. Mouth/Throat: Mucous membranes are moist.  Oropharynx non-erythematous. Neck: No stridor.  No meningeal signs.   Cardiovascular: Normal rate, regular rhythm. Good peripheral circulation. Grossly normal heart sounds.   Respiratory: Normal respiratory effort.  No retractions. Lungs CTAB. Gastrointestinal: Soft and nontender. No distention.  Musculoskeletal: No lower extremity tenderness nor edema. No gross deformities of extremities. Neurologic:  Normal speech and  language. No gross focal neurologic deficits are appreciated.  Skin:  Skin is warm, dry  and intact. No rash noted. Psychiatric: Mood and affect are normal. Speech and behavior are normal.  ____________________________________________   LABS (all labs ordered are listed, but only abnormal results are displayed)  Labs Reviewed  CBC WITH DIFFERENTIAL/PLATELET - Abnormal; Notable for the following:    RBC 5.32 (*)    MCV 79.2 (*)    MCH 25.7 (*)    RDW 16.4 (*)    All other components within normal limits  COMPREHENSIVE METABOLIC PANEL - Abnormal; Notable for the following:    Glucose, Bld 113 (*)    Calcium 8.7 (*)    All other components within normal limits  FIBRIN DERIVATIVES D-DIMER ([facility] ONLY) - Abnormal; Notable for the following:    Fibrin derivatives D-dimer ([facility]) 701 (*)    All other components within normal limits  TROPONIN I   ____________________________________________  EKG  ED ECG REPORT I, Jezel Basto, the attending physician, personally viewed and interpreted this ECG.  Date: 04/24/2016 EKG Time: 20:19 Rate: 58 Rhythm: Borderline sinus bradycardia QRS Axis: normal Intervals: normal ST/T Wave abnormalities: normal Conduction Disturbances: none Narrative Interpretation: unremarkable  ____________________________________________  RADIOLOGY   Dg Chest 2 View  04/24/2016  CLINICAL DATA:  Shortness of breath with right chest wall pain on deep inspiration. No acute injury. EXAM: CHEST  2 VIEW COMPARISON:  02/27/2016 and 01/02/2016. FINDINGS: The heart size and mediastinal contours are stable. There is chronic central airway thickening and interstitial prominence, similar to previous studies. No superimposed edema, airspace disease or pleural effusion identified. Prominent right-sided cervical rib noted. No acute osseous findings are seen. IMPRESSION: Stable chronic central airway thickening and interstitial prominence. No acute cardiopulmonary process.  Prominent right cervical rib. Electronically Signed   By: Carey BullocksWilliam  Veazey M.D.   On: 04/24/2016 18:40   Ct Angio Chest Pe W/cm &/or Wo Cm  04/24/2016  CLINICAL DATA:  Shortness of breath. Right flank and right chest pain. EXAM: CT ANGIOGRAPHY CHEST WITH CONTRAST TECHNIQUE: Multidetector CT imaging of the chest was performed using the standard protocol during bolus administration of intravenous contrast. Multiplanar CT image reconstructions and MIPs were obtained to evaluate the vascular anatomy. CONTRAST:  75 cc of Isovue 370 COMPARISON:  12/27/2015 FINDINGS: Mediastinum/Lymph Nodes: Normal heart size. No pericardial effusion identified. The trachea appears patent and is midline. Normal appearance of the esophagus. The main pulmonary artery appears normal. No lobar or segmental pulmonary artery filling defects identified to suggest an acute pulmonary embolus. Lungs/Pleura: No pleural fluid. Moderate changes of centrilobular emphysema. In the perifissural right middle lobe there is a 8 x 6 mm nodule (7 mm mean diameter) on image 60 of series 6. Not changed from 12/27/2015. In the right apex there is a 6 x 4 mm nodule (5 mm mean diameter) on image 36 of series 6. Unchanged from previous exam Nonspecific area of subpleural atelectasis or consolidation within the right base noted, image 95 of series 6. New from previous exam. Upper abdomen: Previous cholecystectomy. The adrenal glands are normal. Musculoskeletal: No chest wall mass or suspicious bone lesions identified. Review of the MIP images confirms the above findings. IMPRESSION: 1. No evidence for acute pulmonary embolus. 2. Emphysema 3. Small pulmonary nodules are noted measuring up to 7 mm. Non-contrast chest CT at 3-6 months is recommended. If the nodules are stable at time of repeat CT, then future CT at 18-24 months (from today's scan) is considered optional for low-risk patients, but is recommended for high-risk patients. This recommendation follows the  consensus statement:  Guidelines for Management of Incidental Pulmonary Nodules Detected on CT Images:From the Fleischner Society 2017; published online before print (10.1148/radiol.[id number]). Electronically Signed   By: Signa Kell M.D.   On: 04/24/2016 19:54    ____________________________________________   PROCEDURES  Procedure(s) performed: None  Critical Care performed: No ____________________________________________   INITIAL IMPRESSION / ASSESSMENT AND PLAN / ED COURSE  Pertinent labs & imaging results that were available during my care of the patient were reviewed by me and considered in my medical decision making (see chart for details).  The patient has normal breath sounds that are equal bilaterally and no evidence of a tension pneumothorax.  I will obtain 2 view chest x-ray.  I will also check basic blood work and give her morphine and Zofran for her discomfort.  She is low risk for a pulmonary embolism but I do not have a better diagnosis at this time so I will check a d-dimer to see if we can rule out knowing that I will need to proceed with a CT angio chest if it is not within normal limits.  ----------------------------------------- 7:23 PM on 04/24/2016 -----------------------------------------  Patient feels a little bit better but would like more morphine.  I am sending her to the CT scanner given her elevated d-dimer.  I explained to her and her family that if this test is negative and will be appropriate for her to follow up at [facility] because reportedly she has a pulmonologist at that facility.  ----------------------------------------- 8:07 PM on 04/24/2016 -----------------------------------------  Workup was unremarkable.  The patient is comfortably watching TV.  In the meantime I looked her up in the [place] controlled substances database and I see that she is chronically on Suboxone.  I encouraged her to follow up with her pulmonologist and take her  usual Suboxone.  I will give her a dose of Toradol 10 mg IV prior to discharge.  Given the onset of her symptoms early this morning and the very low risk of ACS  there is no indication for repeat troponin (HEART score is 1-2).   ____________________________________________  FINAL CLINICAL IMPRESSION(S) / ED DIAGNOSES  Final diagnoses:  Right-sided chest wall pain     MEDICATIONS GIVEN DURING THIS VISIT:  Medications  morphine 4 MG/ML injection 4 mg (4 mg Intravenous Given 04/24/16 1813)  ondansetron (ZOFRAN) injection 4 mg (4 mg Intravenous Given 04/24/16 1813)  morphine 4 MG/ML injection 4 mg (4 mg Intravenous Given 04/24/16 1933)  iopamidol (ISOVUE-370) 76 % injection 75 mL (75 mLs Intravenous Contrast Given 04/24/16 1937)  ketorolac (TORADOL) 30 MG/ML injection 9.9 mg (9.9 mg Intravenous Given 04/24/16 2021)     NEW OUTPATIENT MEDICATIONS STARTED DURING THIS VISIT:  New Prescriptions   No medications on file      Note:  This document was prepared using Dragon voice recognition software and may include unintentional dictation errors.   Loleta Rose, MD 04/24/16 2033

## 2016-07-07 IMAGING — CT CT RENAL STONE PROTOCOL
1 of 2 series · 14 of 32 positions shown, 18 images · non-contrast
Comparison: Unremarkable.

CLINICAL DATA: 44-year-old female with right lower back pain since
yesterday. Nausea. No vomiting or diarrhea.

EXAM:
CT ABDOMEN AND PELVIS WITHOUT CONTRAST
TECHNIQUE: Multidetector CT imaging of the abdomen and pelvis was performed
following the standard protocol without IV contrast.

[Series 2: stone standard full · axial · 0.86mm/px · z∈[-420,+35]mm · 14 of 101 slices shown, 18 images]
[im 5/101  soft-tissue]
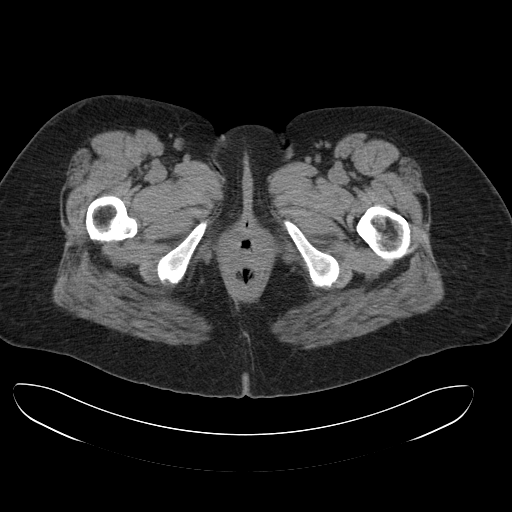
[im 5/101  bone]
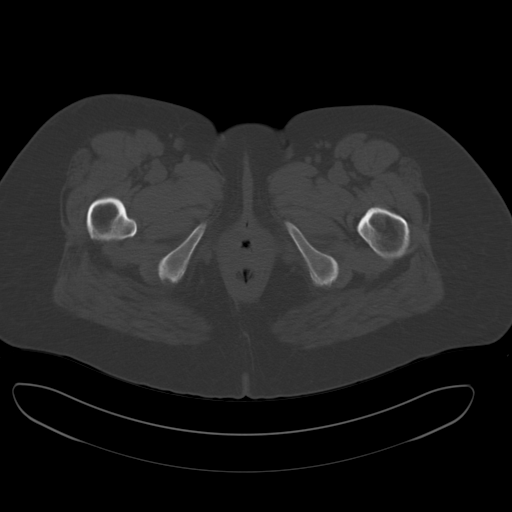
[im 14/101  soft-tissue]
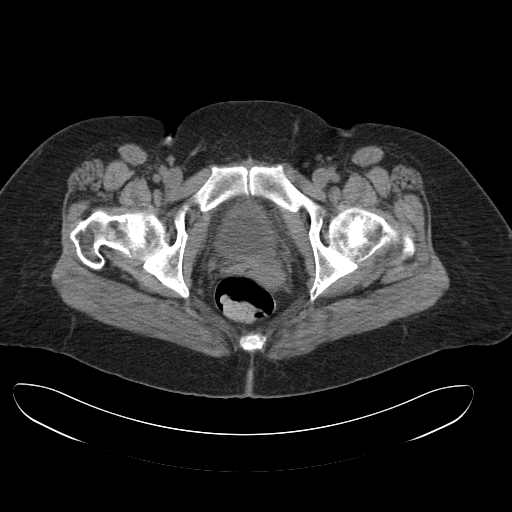
[im 22/101  soft-tissue]
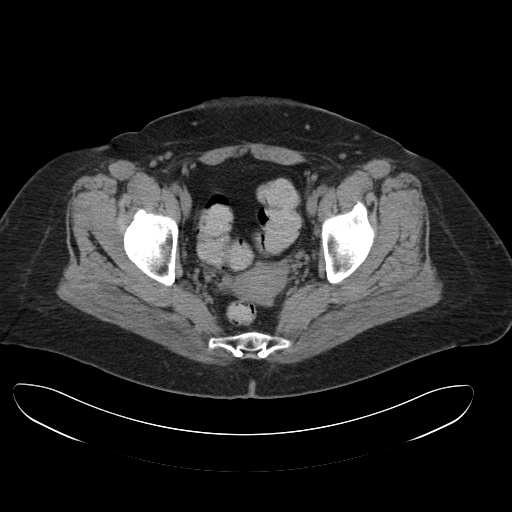
[im 31/101  soft-tissue]
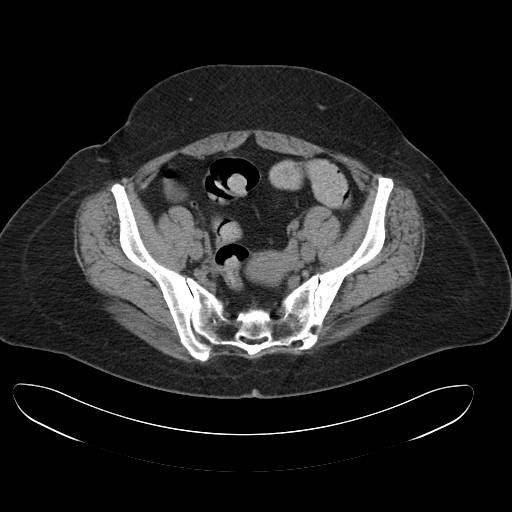
[im 40/101  soft-tissue]
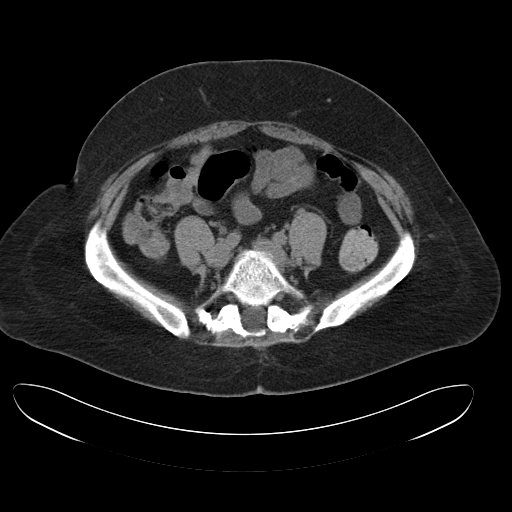
[im 48/101  soft-tissue]
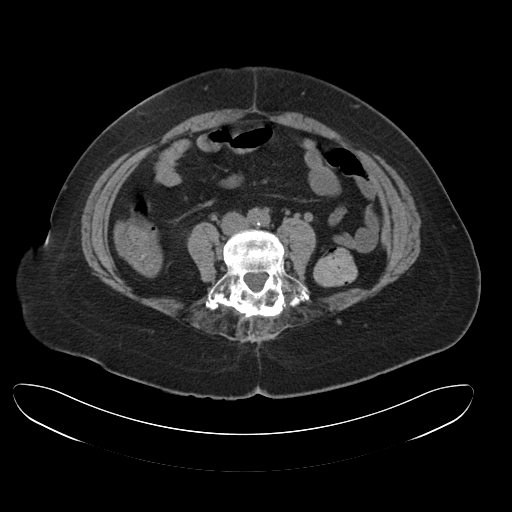
[im 53/101  soft-tissue]
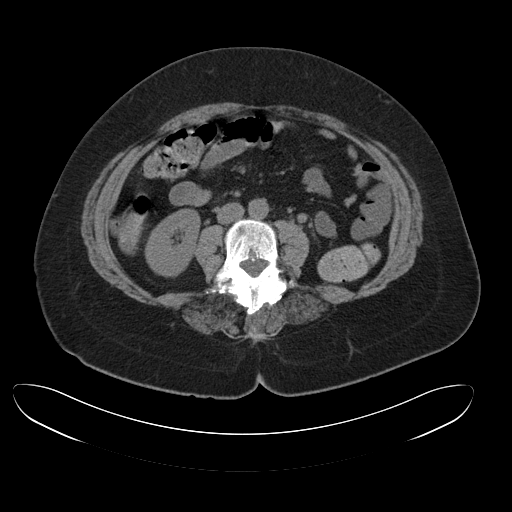
[im 61/101  soft-tissue]
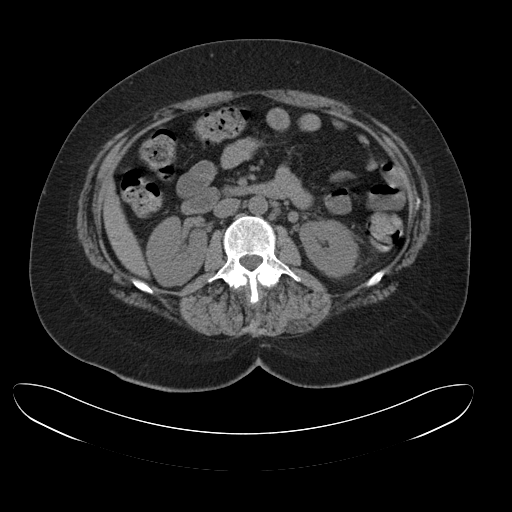
[im 70/101  soft-tissue]
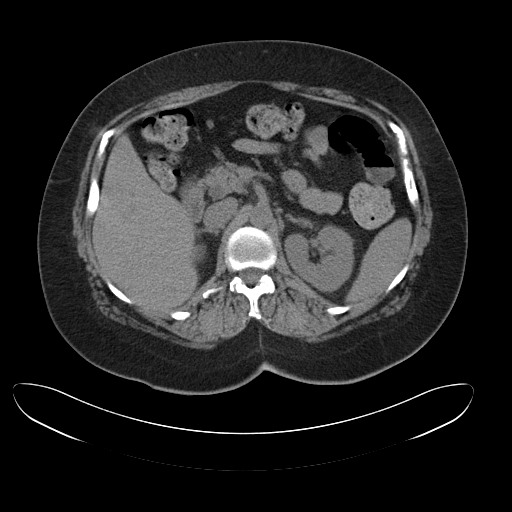
[im 70/101  bone]
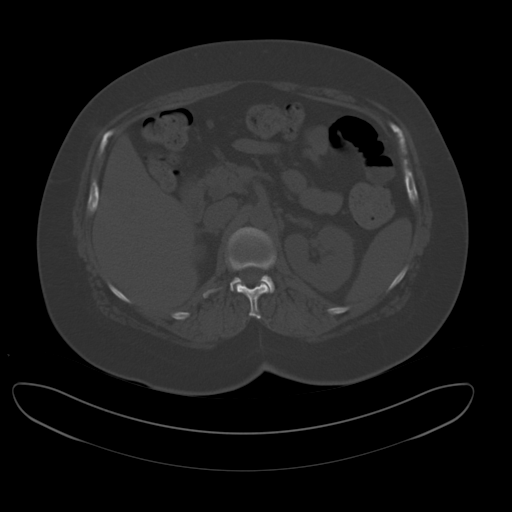
[im 79/101  soft-tissue]
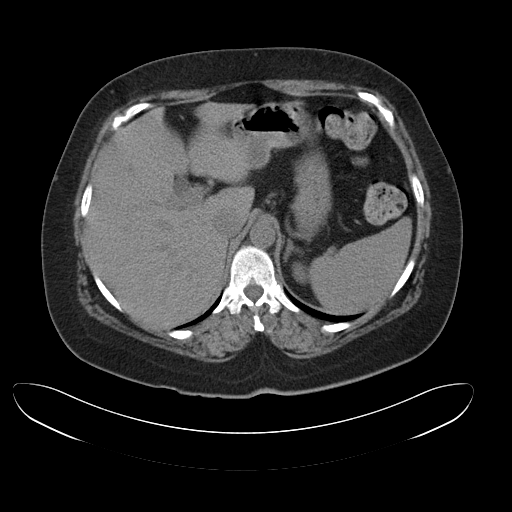
[im 83/101  lung]
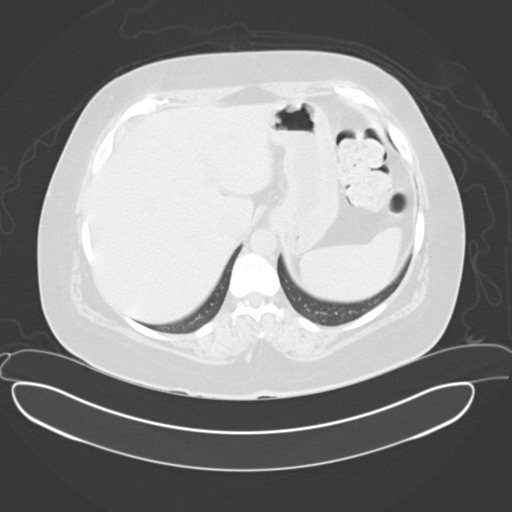
[im 87/101  soft-tissue]
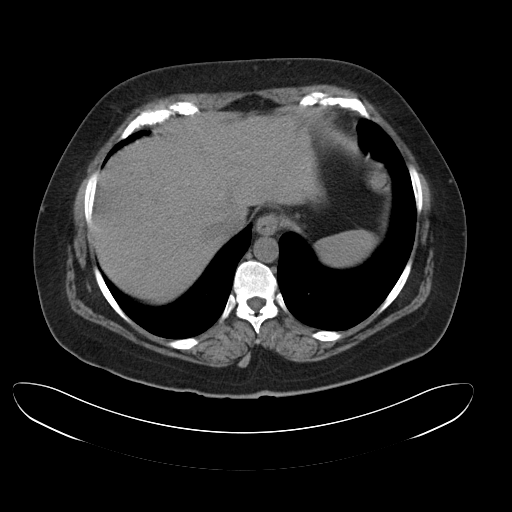
[im 87/101  lung]
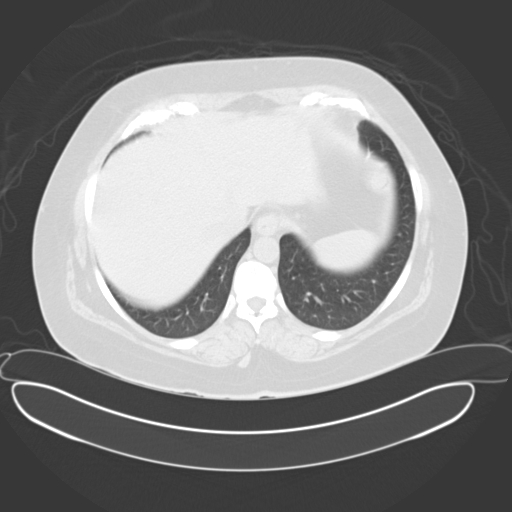
[im 92/101  lung]
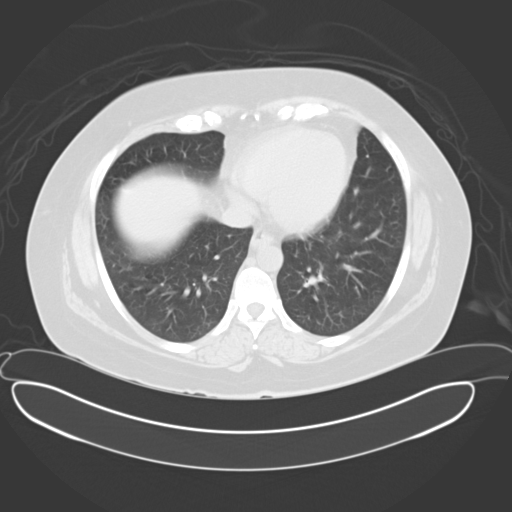
[im 96/101  soft-tissue]
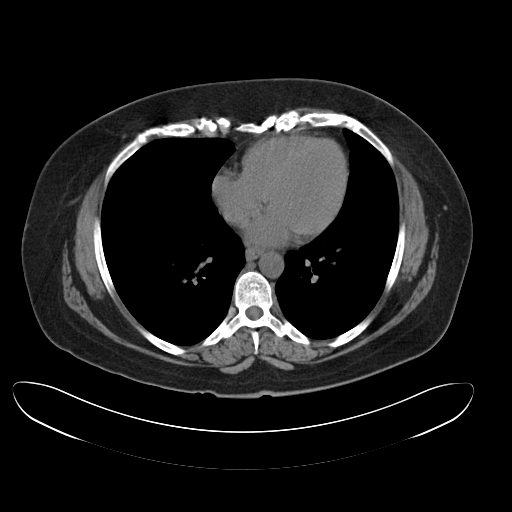
[im 96/101  lung]
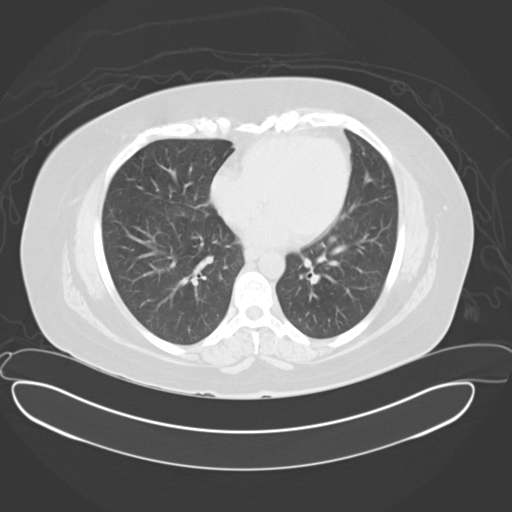

[14 of 32 positions shown; findings below may reference images not displayed]

FINDINGS: Lower chest:  Unremarkable.

Hepatobiliary: No definite cystic or solid hepatic lesions are
identified on today's noncontrast CT examination. Status post
cholecystectomy.

Pancreas: No definite pancreatic mass or peripancreatic inflammatory
changes are identified on today's noncontrast CT examination.

Spleen: Unremarkable.

Adrenals/Urinary Tract: No calcifications are identified within the
collecting system of either kidney, along the course of either
ureter, or within the lumen of the urinary bladder. No
hydroureteronephrosis. However, there is some left-sided perinephric
stranding and haziness in the peripelvic fat associated with the
left kidney. Right kidney is unremarkable in appearance. Urinary
bladder is normal in appearance. Bilateral adrenal glands are normal
in appearance.

Stomach/Bowel: The unenhanced appearance of the stomach is normal.
No pathologic dilatation of small bowel or colon. Normal appendix.

Vascular/Lymphatic: Mild atherosclerotic calcifications are noted
throughout the abdominal and pelvic vasculature, without evidence of
aneurysm. No lymphadenopathy noted in the abdomen or pelvis on
today's noncontrast CT examination.

Reproductive: Uterus and ovaries are unremarkable in appearance.

Other: No significant volume of ascites.  No pneumoperitoneum.

Musculoskeletal: There are no aggressive appearing lytic or blastic
lesions noted in the visualized portions of the skeleton.
IMPRESSION: 1. No urinary tract calculi. Nonspecific left-sided perinephric this
could be related to ascending urinary tract infection (i.e.
left-sided pyelonephritis), or could relate to Stranding. Recently
relieved obstruction (i.e. if the patient has recently passed a
left-sided stone). Correlation with urinalysis is recommended.
2. No other acute findings noted in the abdomen or pelvis.
3. Normal appendix.
4. Status post cholecystectomy.

## 2016-08-31 ENCOUNTER — Emergency Department: Payer: Medicaid Other

## 2016-08-31 ENCOUNTER — Encounter: Payer: Self-pay | Admitting: *Deleted

## 2016-08-31 ENCOUNTER — Emergency Department
Admission: EM | Admit: 2016-08-31 | Discharge: 2016-08-31 | Disposition: A | Discharge status: Home / Self Care | Payer: Medicaid Other | Attending: Emergency Medicine | Admitting: Emergency Medicine

## 2016-08-31 DIAGNOSIS — I509 Heart failure, unspecified: Secondary | ICD-10-CM | POA: Diagnosis not present

## 2016-08-31 DIAGNOSIS — F17210 Nicotine dependence, cigarettes, uncomplicated: Secondary | ICD-10-CM | POA: Diagnosis not present

## 2016-08-31 DIAGNOSIS — J449 Chronic obstructive pulmonary disease, unspecified: Secondary | ICD-10-CM | POA: Insufficient documentation

## 2016-08-31 DIAGNOSIS — M545 Low back pain: Secondary | ICD-10-CM | POA: Insufficient documentation

## 2016-08-31 DIAGNOSIS — Z79899 Other long term (current) drug therapy: Secondary | ICD-10-CM | POA: Insufficient documentation

## 2016-08-31 DIAGNOSIS — J40 Bronchitis, not specified as acute or chronic: Secondary | ICD-10-CM | POA: Diagnosis not present

## 2016-08-31 DIAGNOSIS — Z791 Long term (current) use of non-steroidal anti-inflammatories (NSAID): Secondary | ICD-10-CM | POA: Insufficient documentation

## 2016-08-31 DIAGNOSIS — R0602 Shortness of breath: Secondary | ICD-10-CM | POA: Diagnosis present

## 2016-08-31 DIAGNOSIS — R0600 Dyspnea, unspecified: Secondary | ICD-10-CM

## 2016-08-31 LAB — URINALYSIS COMPLETE WITH MICROSCOPIC (ARMC ONLY)
BACTERIA UA: NONE SEEN
Bilirubin Urine: NEGATIVE
Glucose, UA: NEGATIVE mg/dL
Hgb urine dipstick: NEGATIVE
Ketones, ur: NEGATIVE mg/dL
LEUKOCYTES UA: NEGATIVE
Nitrite: NEGATIVE
PH: 5 (ref 5.0–8.0)
PROTEIN: NEGATIVE mg/dL
RBC / HPF: NONE SEEN RBC/hpf (ref 0–5)
SQUAMOUS EPITHELIAL / LPF: NONE SEEN
Specific Gravity, Urine: 1.005 (ref 1.005–1.030)

## 2016-08-31 LAB — CBC
HEMATOCRIT: 34.5 % — AB (ref 35.0–47.0)
Hemoglobin: 11.8 g/dL — ABNORMAL LOW (ref 12.0–16.0)
MCH: 26.6 pg (ref 26.0–34.0)
MCHC: 34.3 g/dL (ref 32.0–36.0)
MCV: 77.7 fL — AB (ref 80.0–100.0)
PLATELETS: 296 10*3/uL (ref 150–440)
RBC: 4.45 MIL/uL (ref 3.80–5.20)
RDW: 16.3 % — ABNORMAL HIGH (ref 11.5–14.5)
WBC: 6.6 10*3/uL (ref 3.6–11.0)

## 2016-08-31 LAB — BASIC METABOLIC PANEL
Anion gap: 6 (ref 5–15)
BUN: <5 mg/dL — ABNORMAL LOW (ref 6–20)
CHLORIDE: 106 mmol/L (ref 101–111)
CO2: 24 mmol/L (ref 22–32)
CREATININE: 0.73 mg/dL (ref 0.44–1.00)
Calcium: 8.3 mg/dL — ABNORMAL LOW (ref 8.9–10.3)
GFR calc non Af Amer: >60 mL/min (ref >60)
Glucose, Bld: 110 mg/dL — ABNORMAL HIGH (ref 65–99)
POTASSIUM: 3.7 mmol/L (ref 3.5–5.1)
SODIUM: 136 mmol/L (ref 135–145)

## 2016-08-31 LAB — TROPONIN I: Troponin I: <0.03 ng/mL (ref <0.03)

## 2016-08-31 MED ORDER — ALBUTEROL SULFATE (2.5 MG/3ML) 0.083% IN NEBU
5.0000 mg | INHALATION_SOLUTION | Freq: Once | RESPIRATORY_TRACT | Status: DC
Start: 2016-08-31 — End: 2016-08-31

## 2016-08-31 MED ORDER — OXYCODONE-ACETAMINOPHEN 5-325 MG PO TABS
ORAL_TABLET | ORAL | Status: AC
  Administered 2016-08-31: 1
  Filled 2016-08-31: qty 1

## 2016-08-31 MED ORDER — LEVOFLOXACIN 500 MG PO TABS: 500.0000 mg | ORAL_TABLET | Freq: Every day | ORAL | 0 refills | Status: AC

## 2016-08-31 NOTE — Discharge Instructions (Signed)
Please increase her Lasix from 40 mg daily to 60 mg daily for the next 5 days. Please take your antibiotic as prescribed for his entire duration. Please call the number provided to arrange a cardiology follow-up appointment as soon as possible. Return to the emergency department for any worsening trouble breathing, any chest pain, or any other symptom personally concerning to yourself.

## 2016-08-31 NOTE — ED Provider Notes (Addendum)
[facility] Emergency Department Provider Note  Time seen: 7:39 PM  I have reviewed the triage vital signs and the nursing notes.   HISTORY  Chief Complaint Leg Pain and Shortness of Breath    HPI Denise Valentine is a 45 y.o. female with a past medical history of COPD, asthma, anxiety, presents to the emergency department for difficulty breathing weight gain and lower external swelling. According to the patient for the past one week she has been having lower extremity swelling, states a 9 pound weight gain over the past 2 weeks. She states for the past several weeks she has been coughing, with increased shortness of breath especially when lying flat. Denies any chest pain. She does state some lower back pain since moving a mattress 2 days ago. States mild dysuria. Describes his shortness breath is mild, moderate when lying flat or with exertion. Denies any chest pain. States her lower back as a dull aching pain, mild, moderate with movement.  Past Medical History:  Diagnosis Date  . Anxiety   . Asthma   . COPD (chronic obstructive pulmonary disease) (HCC)   . Depression   . GERD (gastroesophageal reflux disease)   . Hepatitis C   . Ovarian cyst   . Seizures [facility](HCC)     Patient Active Problem List   Diagnosis Date Noted  . Acute on chronic respiratory failure with hypoxia (HCC) 02/27/2016    Past Surgical History:  Procedure Laterality Date  . BACK SURGERY    . CHOLECYSTECTOMY    . TONSILLECTOMY    . TUBAL LIGATION    . WISDOM TOOTH EXTRACTION      Prior to Admission medications   Medication Sig Start Date End Date Taking? Authorizing Provider  ADVAIR DISKUS 250-50 MCG/DOSE AEPB Inhale 1 puff into the lungs 2 (two) times daily. 02/17/16   Historical Provider, MD  albuterol (PROVENTIL HFA;VENTOLIN HFA) 108 (90 Base) MCG/ACT inhaler Inhale 2 puffs into the lungs every 6 (six) hours as needed for wheezing or shortness of breath. 02/27/16   Gayla DossEryka A Gayle, MD   albuterol (PROVENTIL) (2.5 MG/3ML) 0.083% nebulizer solution Inhale 3 mLs into the lungs every 6 (six) hours as needed. 02/17/16   Historical Provider, MD  budesonide-formoterol (SYMBICORT) 160-4.5 MCG/ACT inhaler Inhale 2 puffs into the lungs 2 (two) times daily.    Historical Provider, MD  buprenorphine-naloxone (SUBOXONE) 8-2 MG SUBL SL tablet Place 1 tablet under the tongue 3 (three) times daily.    Historical Provider, MD  ferrous sulfate (EQL SLOW RELEASE IRON) 160 (50 Fe) MG TBCR SR tablet Take 1 tablet (160 mg total) by mouth daily. 12/27/15   Emily FilbertJonathan E Williams, MD  FLUoxetine (PROZAC) 40 MG capsule Take 40 mg by mouth daily.    Historical Provider, MD  furosemide (LASIX) 20 MG tablet Take 1 tablet (20 mg total) by mouth daily. 01/03/16 01/02/17  Arnaldo NatalPaul F Malinda, MD  gabapentin (NEURONTIN) 300 MG capsule Take 300 mg by mouth 3 (three) times daily.    Historical Provider, MD  guaiFENesin (MUCINEX) 600 MG 12 hr tablet Take 1 tablet (600 mg total) by mouth 2 (two) times daily. 03/02/16   Auburn BilberryShreyang Patel, MD  guaiFENesin-codeine 100-10 MG/5ML syrup Take 10 mLs by mouth every 4 (four) hours as needed for cough. 03/02/16   Auburn BilberryShreyang Patel, MD  ibuprofen (ADVIL,MOTRIN) 800 MG tablet Take 1 tablet (800 mg total) by mouth every 8 (eight) hours as needed for moderate pain. 10/26/15   Joni Reiningonald K Smith, PA-C  pantoprazole (PROTONIX) 40 MG tablet Take 40 mg by mouth daily.    Historical Provider, MD  predniSONE (STERAPRED UNI-PAK 21 TAB) 10 MG (21) TBPK tablet Take 1 tablet (10 mg total) by mouth daily. 03/02/16   Auburn Bilberry, MD  spironolactone (ALDACTONE) 25 MG tablet Take 25 mg by mouth daily.    Historical Provider, MD  traZODone (DESYREL) 100 MG tablet Take 100 mg by mouth at bedtime.    Historical Provider, MD    Allergies  Allergen Reactions  . Erythromycin Rash    Family History  Problem Relation Age of Onset  . CAD Mother     Social History Social History  Substance Use Topics  . Smoking  status: Current Every Day Smoker    Packs/day: 5.00    Types: Cigarettes  . Smokeless tobacco: Never Used  . Alcohol use No    Review of Systems Constitutional: Negative for fever. Cardiovascular: Negative for chest pain. Respiratory: Negative for shortness of breath. Gastrointestinal: Negative for abdominal pain Genitourinary: Mild dysuria. Musculoskeletal: Mild to moderate lower back pain Neurological: Negative for headache 10-point ROS otherwise negative.  ____________________________________________   PHYSICAL EXAM:  VITAL SIGNS: ED Triage Vitals  Enc Vitals Group     BP 08/31/16 1501 109/71     Pulse Rate 08/31/16 1501 79     Resp 08/31/16 1501 (!) 22     Temp 08/31/16 1501 98.2 F (36.8 C)     Temp Source 08/31/16 1501 Oral     SpO2 08/31/16 1501 95 %     Weight 08/31/16 1503 219 lb (99.3 kg)     Height 08/31/16 1503 5\' 7"  (1.702 m)     Head Circumference --      Peak Flow --      Pain Score 08/31/16 1503 8     Pain Loc --      Pain Edu? --      Excl. in GC? --     Constitutional: Alert and oriented. Well appearing and in no distress. Eyes: Normal exam ENT   Head: Normocephalic and atraumatic.   Mouth/Throat: Mucous membranes are moist. Cardiovascular: Normal rate, regular rhythm. No murmur Respiratory: Frequent cough, mild bilateral rhonchi on exam. No obvious rales. No obvious wheeze. Gastrointestinal: Soft and nontender. No distention.   Musculoskeletal: Nontender with normal range of motion in all extremities. Mild lower extremity edema bilaterally. Mild lower back pain Neurologic:  Normal speech and language. No gross focal neurologic deficits  Skin:  Skin is warm, dry and intact.  Psychiatric: Mood and affect are normal.   ____________________________________________    EKG  EKG reviewed and interpreted, so shows normal sinus rhythm at 77 bpm, narrow QRS, normal axis, normal intervals, no ST changes. Normal  EKG.  ____________________________________________    RADIOLOGY  Chest x-ray shows bronchitic changes, possible mild edema.  ____________________________________________   INITIAL IMPRESSION / ASSESSMENT AND PLAN / ED COURSE  Pertinent labs & imaging results that were available during my care of the patient were reviewed by me and considered in my medical decision making (see chart for details).  Patient presents for shortness of breath also lower back pain for the past 2 days since moving a mattress. Overall the patient appears well, she does have a frequent cough with mild interstitial edema on x-ray along with possible bronchitic changes. Patient's labs are largely within normal limits, troponin is pending. If the patient's troponin is negative we will increase the patient's home Lasix from 40 mg daily to  60 mg daily for the next 5 days, have her follow up with her primary care doctor. I also discuss having her follow up with a cardiologist, she is agreeable to this plan. Given the patient's frequent cough which she states is happening over the last few weeks we will place the patient on a Z-Pak for possible acute bronchitis. Patient is agreeable to this plan.  Troponin is negative. ____________________________________________   FINAL CLINICAL IMPRESSION(S) / ED DIAGNOSES  Dyspnea Bronchitis Muscular lower back pain CHF    Minna Antis, MD 08/31/16 1948    Minna Antis, MD 08/31/16 2024

## 2016-08-31 NOTE — ED Triage Notes (Signed)
Pt arrived to ED form home reporting multiple medical complaints. Pt reports she has back pain but also states she has had a lumbar fusion and is not supposed to lift heavy weights but that she moved a mattress yesterday. Pt also reports feeling as though she has a UTI due to burning with urination. PT also states she has been retaining fluid and is now having increased WOB. Pt states she has gained 9 pounds over the past 2 weeks even with take her lasix. +1 edema noted to pts feet in triage. Pt able to speak in complete sentences and ambulate independently.

## 2016-10-25 ENCOUNTER — Emergency Department
Admission: EM | Admit: 2016-10-25 | Discharge: 2016-10-25 | Disposition: A | Discharge status: Home / Self Care | Payer: Medicaid Other | Attending: Student in an Organized Health Care Education/Training Program | Admitting: Student in an Organized Health Care Education/Training Program

## 2016-10-25 ENCOUNTER — Emergency Department: Payer: Medicaid Other

## 2016-10-25 ENCOUNTER — Encounter: Payer: Self-pay | Admitting: Emergency Medicine

## 2016-10-25 DIAGNOSIS — J449 Chronic obstructive pulmonary disease, unspecified: Secondary | ICD-10-CM | POA: Insufficient documentation

## 2016-10-25 DIAGNOSIS — Z791 Long term (current) use of non-steroidal anti-inflammatories (NSAID): Secondary | ICD-10-CM | POA: Insufficient documentation

## 2016-10-25 DIAGNOSIS — Z79899 Other long term (current) drug therapy: Secondary | ICD-10-CM | POA: Diagnosis not present

## 2016-10-25 DIAGNOSIS — N9489 Other specified conditions associated with female genital organs and menstrual cycle: Secondary | ICD-10-CM | POA: Insufficient documentation

## 2016-10-25 DIAGNOSIS — F17210 Nicotine dependence, cigarettes, uncomplicated: Secondary | ICD-10-CM | POA: Diagnosis not present

## 2016-10-25 DIAGNOSIS — J45909 Unspecified asthma, uncomplicated: Secondary | ICD-10-CM | POA: Diagnosis not present

## 2016-10-25 DIAGNOSIS — R569 Unspecified convulsions: Secondary | ICD-10-CM

## 2016-10-25 DIAGNOSIS — G40909 Epilepsy, unspecified, not intractable, without status epilepticus: Secondary | ICD-10-CM | POA: Diagnosis not present

## 2016-10-25 LAB — COMPREHENSIVE METABOLIC PANEL
ALBUMIN: 3.8 g/dL (ref 3.5–5.0)
ALT: 35 U/L (ref 14–54)
ANION GAP: 8 (ref 5–15)
AST: 41 U/L (ref 15–41)
Alkaline Phosphatase: 74 U/L (ref 38–126)
BUN: 9 mg/dL (ref 6–20)
CO2: 29 mmol/L (ref 22–32)
Calcium: 9.1 mg/dL (ref 8.9–10.3)
Chloride: 101 mmol/L (ref 101–111)
Creatinine, Ser: 0.79 mg/dL (ref 0.44–1.00)
GFR calc Af Amer: >60 mL/min (ref >60)
GFR calc non Af Amer: >60 mL/min (ref >60)
GLUCOSE: 94 mg/dL (ref 65–99)
POTASSIUM: 4.8 mmol/L (ref 3.5–5.1)
SODIUM: 138 mmol/L (ref 135–145)
TOTAL PROTEIN: 7.5 g/dL (ref 6.5–8.1)

## 2016-10-25 LAB — URINE DRUG SCREEN, QUALITATIVE (ARMC ONLY)
AMPHETAMINES, UR SCREEN: NOT DETECTED
BENZODIAZEPINE, UR SCRN: NOT DETECTED
Barbiturates, Ur Screen: NOT DETECTED
Cannabinoid 50 Ng, Ur ~~LOC~~: NOT DETECTED
Cocaine Metabolite,Ur ~~LOC~~: NOT DETECTED
MDMA (ECSTASY) UR SCREEN: NOT DETECTED
METHADONE SCREEN, URINE: NOT DETECTED
Opiate, Ur Screen: NOT DETECTED
PHENCYCLIDINE (PCP) UR S: NOT DETECTED
TRICYCLIC, UR SCREEN: NOT DETECTED

## 2016-10-25 LAB — CBC WITH DIFFERENTIAL/PLATELET
BASOS ABS: 0 10*3/uL (ref 0–0.1)
BASOS PCT: 1 %
EOS ABS: 0.2 10*3/uL (ref 0–0.7)
Eosinophils Relative: 2 %
HEMATOCRIT: 41.2 % (ref 35.0–47.0)
HEMOGLOBIN: 13.6 g/dL (ref 12.0–16.0)
Lymphocytes Relative: 26 %
Lymphs Abs: 2.4 10*3/uL (ref 1.0–3.6)
MCH: 26.3 pg (ref 26.0–34.0)
MCHC: 33 g/dL (ref 32.0–36.0)
MCV: 79.7 fL — ABNORMAL LOW (ref 80.0–100.0)
MONO ABS: 0.9 10*3/uL (ref 0.2–0.9)
Monocytes Relative: 10 %
NEUTROS ABS: 5.8 10*3/uL (ref 1.4–6.5)
NEUTROS PCT: 63 %
Platelets: 253 10*3/uL (ref 150–440)
RBC: 5.17 MIL/uL (ref 3.80–5.20)
RDW: 16.7 % — AB (ref 11.5–14.5)
WBC: 9.2 10*3/uL (ref 3.6–11.0)

## 2016-10-25 LAB — URINALYSIS COMPLETE WITH MICROSCOPIC (ARMC ONLY)
BACTERIA UA: NONE SEEN
Bilirubin Urine: NEGATIVE
Glucose, UA: NEGATIVE mg/dL
Hgb urine dipstick: NEGATIVE
KETONES UR: NEGATIVE mg/dL
LEUKOCYTES UA: NEGATIVE
Nitrite: NEGATIVE
PH: 7 (ref 5.0–8.0)
PROTEIN: NEGATIVE mg/dL
Specific Gravity, Urine: 1.005 (ref 1.005–1.030)

## 2016-10-25 LAB — HCG, QUANTITATIVE, PREGNANCY

## 2016-10-25 MED ORDER — LORAZEPAM 2 MG/ML IJ SOLN
1.0000 mg | Freq: Once | INTRAMUSCULAR | Status: AC
  Administered 2016-10-25: 1 mg via INTRAVENOUS

## 2016-10-25 MED ORDER — LORAZEPAM 2 MG/ML IJ SOLN
INTRAMUSCULAR | Status: AC
  Administered 2016-10-25: 1 mg via INTRAVENOUS
  Filled 2016-10-25: qty 1

## 2016-10-25 MED ORDER — IOPAMIDOL (ISOVUE-300) INJECTION 61%
75.0000 mL | Freq: Once | INTRAVENOUS | Status: AC | PRN
  Administered 2016-10-25: 75 mL via INTRAVENOUS

## 2016-10-25 NOTE — ED Notes (Signed)
Pt taken to CT.

## 2016-10-25 NOTE — ED Notes (Signed)
Pt given sprite per Dr. Roxan Hockeyobinson request

## 2016-10-25 NOTE — ED Provider Notes (Signed)
[facility] Emergency Department Provider Note    None    (approximate)  I have reviewed the triage vital signs and the nursing notes.   HISTORY  Chief Complaint Seizures    HPI Denise Valentine is a 45 y.o. female with a history of anxiety, depression reported hep C as well as reported seizures that she's not on any known antiepileptic drugs presents with several episodes of reported seizure activity 3 times today. Patient states she's also been having chest pain and a headache since the seizures. In the ER patient had 2 additional episodes of seizure-like activity that were generalized and clonic. The patient was able to follow commands during these shaking spells. She did not lose consciousness. She was crying and when asked to stop she did.  She did not bite her tongue. There is no loss of urinary continence.   Past Medical History:  Diagnosis Date  . Anxiety   . Asthma   . COPD (chronic obstructive pulmonary disease) (HCC)   . Depression   . GERD (gastroesophageal reflux disease)   . Hepatitis C   . Ovarian cyst   . Seizures (HCC)    Family History  Problem Relation Age of Onset  . CAD Mother    Past Surgical History:  Procedure Laterality Date  . BACK SURGERY    . CHOLECYSTECTOMY    . TONSILLECTOMY    . TUBAL LIGATION    . WISDOM TOOTH EXTRACTION     Patient Active Problem List   Diagnosis Date Noted  . Acute on chronic respiratory failure with hypoxia (HCC) 02/27/2016      Prior to Admission medications   Medication Sig Start Date End Date Taking? Authorizing Provider  ADVAIR DISKUS 250-50 MCG/DOSE AEPB Inhale 1 puff into the lungs 2 (two) times daily. 02/17/16   Historical Provider, MD  albuterol (PROVENTIL HFA;VENTOLIN HFA) 108 (90 Base) MCG/ACT inhaler Inhale 2 puffs into the lungs every 6 (six) hours as needed for wheezing or shortness of breath. 02/27/16   Gayla DossEryka A Gayle, MD  albuterol (PROVENTIL) (2.5 MG/3ML) 0.083% nebulizer  solution Inhale 3 mLs into the lungs every 6 (six) hours as needed. 02/17/16   Historical Provider, MD  budesonide-formoterol (SYMBICORT) 160-4.5 MCG/ACT inhaler Inhale 2 puffs into the lungs 2 (two) times daily.    Historical Provider, MD  buprenorphine-naloxone (SUBOXONE) 8-2 MG SUBL SL tablet Place 1 tablet under the tongue 3 (three) times daily.    Historical Provider, MD  ferrous sulfate (EQL SLOW RELEASE IRON) 160 (50 Fe) MG TBCR SR tablet Take 1 tablet (160 mg total) by mouth daily. 12/27/15   Emily FilbertJonathan E Williams, MD  FLUoxetine (PROZAC) 40 MG capsule Take 40 mg by mouth daily.    Historical Provider, MD  furosemide (LASIX) 20 MG tablet Take 1 tablet (20 mg total) by mouth daily. 01/03/16 01/02/17  Arnaldo NatalPaul F Malinda, MD  gabapentin (NEURONTIN) 300 MG capsule Take 300 mg by mouth 3 (three) times daily.    Historical Provider, MD  guaiFENesin (MUCINEX) 600 MG 12 hr tablet Take 1 tablet (600 mg total) by mouth 2 (two) times daily. 03/02/16   Auburn BilberryShreyang Patel, MD  guaiFENesin-codeine 100-10 MG/5ML syrup Take 10 mLs by mouth every 4 (four) hours as needed for cough. 03/02/16   Auburn BilberryShreyang Patel, MD  ibuprofen (ADVIL,MOTRIN) 800 MG tablet Take 1 tablet (800 mg total) by mouth every 8 (eight) hours as needed for moderate pain. 10/26/15   Joni Reiningonald K Smith, PA-C  pantoprazole (PROTONIX)  40 MG tablet Take 40 mg by mouth daily.    Historical Provider, MD  predniSONE (STERAPRED UNI-PAK 21 TAB) 10 MG (21) TBPK tablet Take 1 tablet (10 mg total) by mouth daily. 03/02/16   Auburn Bilberry, MD  spironolactone (ALDACTONE) 25 MG tablet Take 25 mg by mouth daily.    Historical Provider, MD  traZODone (DESYREL) 100 MG tablet Take 100 mg by mouth at bedtime.    Historical Provider, MD    Allergies Erythromycin    Social History Social History  Substance Use Topics  . Smoking status: Current Every Day Smoker    Packs/day: 5.00    Types: Cigarettes  . Smokeless tobacco: Never Used  . Alcohol use No    Review of  Systems Patient denies headaches, rhinorrhea, blurry vision, numbness, shortness of breath, chest pain, edema, cough, abdominal pain, nausea, vomiting, diarrhea, dysuria, fevers, rashes or hallucinations unless otherwise stated above in HPI. ____________________________________________   PHYSICAL EXAM:  VITAL SIGNS: Vitals:   10/25/16 1647  Temp: 98.3 F (36.8 C)    Constitutional: Alert, anxious, shaking in bed but will will follow commands Eyes: Conjunctivae are normal. PERRL. EOMI. Head: Atraumatic. Nose: No congestion/rhinnorhea. Mouth/Throat: Mucous membranes are moist.  Oropharynx non-erythematous.  No tongue laceration Neck: No stridor. Painless ROM. No cervical spine tenderness to palpation Hematological/Lymphatic/Immunilogical: No cervical lymphadenopathy. Cardiovascular: Normal rate, regular rhythm. Grossly normal heart sounds.  Good peripheral circulation. Respiratory: Normal respiratory effort.  No retractions. Lungs CTAB. Gastrointestinal: Soft and nontender. No distention. No abdominal bruits. No CVA tenderness. Genitourinary: Musculoskeletal: No lower extremity tenderness nor edema.  No joint effusions. Neurologic:  Normal speech and language. No gross focal neurologic deficits are appreciated. No gait instability. Skin:  Skin is warm, dry and intact. No rash noted. Psychiatric: Mood and affect are normal. Speech and behavior are normal.  ____________________________________________   LABS (all labs ordered are listed, but only abnormal results are displayed)  Results for orders placed or performed during the hospital encounter of 10/25/16 (from the past 24 hour(s))  CBC with Differential/Platelet     Status: Abnormal   Collection Time: 10/25/16  4:58 PM  Result Value Ref Range   WBC 9.2 3.6 - 11.0 K/uL   RBC 5.17 3.80 - 5.20 MIL/uL   Hemoglobin 13.6 12.0 - 16.0 g/dL   HCT 16.1 09.6 - 04.5 %   MCV 79.7 (L) 80.0 - 100.0 fL   MCH 26.3 26.0 - 34.0 pg   MCHC  33.0 32.0 - 36.0 g/dL   RDW 40.9 (H) 81.1 - 91.4 %   Platelets 253 150 - 440 K/uL   Neutrophils Relative % 63 %   Neutro Abs 5.8 1.4 - 6.5 K/uL   Lymphocytes Relative 26 %   Lymphs Abs 2.4 1.0 - 3.6 K/uL   Monocytes Relative 10 %   Monocytes Absolute 0.9 0.2 - 0.9 K/uL   Eosinophils Relative 2 %   Eosinophils Absolute 0.2 0 - 0.7 K/uL   Basophils Relative 1 %   Basophils Absolute 0.0 0 - 0.1 K/uL  Comprehensive metabolic panel     Status: Abnormal   Collection Time: 10/25/16  4:58 PM  Result Value Ref Range   Sodium 138 135 - 145 mmol/L   Potassium 4.8 3.5 - 5.1 mmol/L   Chloride 101 101 - 111 mmol/L   CO2 29 22 - 32 mmol/L   Glucose, Bld 94 65 - 99 mg/dL   BUN 9 6 - 20 mg/dL   Creatinine, Ser 7.82  0.44 - 1.00 mg/dL   Calcium 9.1 8.9 - 13.210.3 mg/dL   Total Protein 7.5 6.5 - 8.1 g/dL   Albumin 3.8 3.5 - 5.0 g/dL   AST 41 15 - 41 U/L   ALT 35 14 - 54 U/L   Alkaline Phosphatase 74 38 - 126 U/L   Total Bilirubin <0.1 (L) 0.3 - 1.2 mg/dL   GFR calc non Af Amer >60 >60 mL/min   GFR calc Af Amer >60 >60 mL/min   Anion gap 8 5 - 15  hCG, quantitative, pregnancy     Status: None   Collection Time: 10/25/16  5:01 PM  Result Value Ref Range   hCG, Beta Chain, Quant, S <1 <5 mIU/mL  Urinalysis complete, with microscopic ([facility] only)     Status: Abnormal   Collection Time: 10/25/16  5:33 PM  Result Value Ref Range   Color, Urine STRAW (A) YELLOW   APPearance CLEAR (A) CLEAR   Glucose, UA NEGATIVE NEGATIVE mg/dL   Bilirubin Urine NEGATIVE NEGATIVE   Ketones, ur NEGATIVE NEGATIVE mg/dL   Specific Gravity, Urine 1.005 1.005 - 1.030   Hgb urine dipstick NEGATIVE NEGATIVE   pH 7.0 5.0 - 8.0   Protein, ur NEGATIVE NEGATIVE mg/dL   Nitrite NEGATIVE NEGATIVE   Leukocytes, UA NEGATIVE NEGATIVE   RBC / HPF 0-5 0 - 5 RBC/hpf   WBC, UA 0-5 0 - 5 WBC/hpf   Bacteria, UA NONE SEEN NONE SEEN   Squamous Epithelial / LPF 0-5 (A) NONE SEEN  Urine Drug Screen, Qualitative ([facility] only)     Status:  None   Collection Time: 10/25/16  5:33 PM  Result Value Ref Range   Tricyclic, Ur Screen NONE DETECTED NONE DETECTED   Amphetamines, Ur Screen NONE DETECTED NONE DETECTED   MDMA (Ecstasy)Ur Screen NONE DETECTED NONE DETECTED   Cocaine Metabolite,Ur [place] NONE DETECTED NONE DETECTED   Opiate, Ur Screen NONE DETECTED NONE DETECTED   Phencyclidine (PCP) Ur S NONE DETECTED NONE DETECTED   Cannabinoid 50 Ng, Ur [place] NONE DETECTED NONE DETECTED   Barbiturates, Ur Screen NONE DETECTED NONE DETECTED   Benzodiazepine, Ur Scrn NONE DETECTED NONE DETECTED   Methadone Scn, Ur NONE DETECTED NONE DETECTED   ____________________________________________  EKG My review and personal interpretation at Time: 16:57   Indication: seizure  Rate: 90  Rhythm: sinus Axis: normal Other: non specici st changes, no acute ischemia ____________________________________________  RADIOLOGY  I personally reviewed all radiographic images ordered to evaluate for the above acute complaints and reviewed radiology reports and findings.  These findings were personally discussed with the patient.  Please see medical record for radiology report. ____________________________________________   PROCEDURES  Procedure(s) performed: none Procedures    Critical Care performed: no ____________________________________________   INITIAL IMPRESSION / ASSESSMENT AND PLAN / ED COURSE  Pertinent labs & imaging results that were available during my care of the patient were reviewed by me and considered in my medical decision making (see chart for details).  DDX: seizure, PNES, dysrhythmia, electrolyte abn, hypoglycemia  Denise Valentine is a 45 y.o. who presents to the ED with With buffered episodes of seizure activity. Patient had one episode witnessed upon arrival to the ER. Patient was conscious throughout the episode. She was following commands. Not clinically consistent with status versus true seizure. Patient does have underlying  significant medical comorbidities are full order of electrolytes as well as radiographic imaging to evaluate for any other associated acute traumatic injury. EKG shows no evidence of  acute ischemia or dysrhythmia.  The patient will be placed on continuous pulse oximetry and telemetry for monitoring.  Laboratory evaluation will be sent to evaluate for the above complaints.     Clinical Course as of Oct 26 1943  Wed Oct 25, 2016  1739 No significant acidosis suggesting against true seizure activity or status.  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 States that her seizures become more frequent when she is undergoing more stress.  [PR]  1859 Patient had another witnessed shaking spell. Patient was conscious during the episode. This was not followed by postictal period. When asked her to stop she did. This is clinically not consistent with status epilepticus. She got normal vitals as well as normal metabolic workup. Her CT head is normal appearing. No clinical signs of meningismus. Do not feel lumbar puncture clinically indicated at this time.   [PR]    Clinical Course User Index [PR] Willy Eddy, MD   ----------------------------------------- 7:46 PM on 10/25/2016 -----------------------------------------  Patient reassessed and is currently requesting discharge home. Patient admits to increasing stressors at home. States that she feels much better after receiving the Ativan and he'll safe going home. On review of her medical chart patient has been referred to neurology in the past but has no showed multiple times. I did discuss the need for follow-up with neurology as many patients do suffer from underlying seizure disorder but fortunately her workup at this point is not showing any evidence of status epilepticus. Patient  agrees to follow-up. Discussed seizure first aid.  Patient was able to tolerate PO and was able to ambulate with a steady gait.  Have discussed with the patient and available family all diagnostics and treatments performed thus far and all questions were answered to the best of my ability. The patient demonstrates understanding and agreement with plan.   ____________________________________________   FINAL CLINICAL IMPRESSION(S) / ED DIAGNOSES  Final diagnoses:  Seizure-like activity (HCC)      NEW MEDICATIONS STARTED DURING THIS VISIT:  New Prescriptions   No medications on file     Note:  This document was prepared using Dragon voice recognition software and may include unintentional dictation errors.    Willy Eddy, MD 10/25/16 [phone]

## 2016-10-25 NOTE — ED Notes (Signed)
Pt w/ seizure like activity, thrashing in bed, Dr. Roxan Hockeyobinson at bedside, head protected, seizure pads in place

## 2016-10-25 NOTE — ED Notes (Signed)
Pt with additional seizure like activity, pt able to follow directions during activity like "look at me" and "tell me your name".  VORB from robinson 1mg  ativan

## 2016-10-25 NOTE — ED Notes (Signed)
Pt returned from CT °

## 2016-10-25 NOTE — ED Triage Notes (Signed)
Pt to rm 10 via EMS, per EMS pt had seizure activity x 3, pt hx of seizures but per family report to EMS has not had any in years, no seizure meds in pt home meds.  Pt responsive to stimulation.

## 2016-10-25 NOTE — ED Notes (Signed)
Dr. Roxan Hockeyobinson at bedside to discuss discharge

## 2016-10-25 NOTE — ED Notes (Signed)
Pt more alert, A&Ox4, pt ambulatory with assistance to toilet.

## 2016-10-25 NOTE — ED Notes (Signed)
Gave pt blanket

## 2016-11-01 ENCOUNTER — Emergency Department: Payer: Medicaid Other

## 2016-11-01 ENCOUNTER — Emergency Department
Admission: EM | Admit: 2016-11-01 | Discharge: 2016-11-01 | Disposition: A | Discharge status: Home / Self Care | Payer: Medicaid Other | Attending: Emergency Medicine | Admitting: Emergency Medicine

## 2016-11-01 ENCOUNTER — Encounter: Payer: Self-pay | Admitting: Emergency Medicine

## 2016-11-01 DIAGNOSIS — F445 Conversion disorder with seizures or convulsions: Secondary | ICD-10-CM

## 2016-11-01 DIAGNOSIS — G40909 Epilepsy, unspecified, not intractable, without status epilepticus: Secondary | ICD-10-CM | POA: Insufficient documentation

## 2016-11-01 DIAGNOSIS — Z79899 Other long term (current) drug therapy: Secondary | ICD-10-CM | POA: Diagnosis not present

## 2016-11-01 DIAGNOSIS — Z791 Long term (current) use of non-steroidal anti-inflammatories (NSAID): Secondary | ICD-10-CM | POA: Insufficient documentation

## 2016-11-01 DIAGNOSIS — J449 Chronic obstructive pulmonary disease, unspecified: Secondary | ICD-10-CM | POA: Diagnosis not present

## 2016-11-01 DIAGNOSIS — Z87891 Personal history of nicotine dependence: Secondary | ICD-10-CM | POA: Diagnosis not present

## 2016-11-01 DIAGNOSIS — J45909 Unspecified asthma, uncomplicated: Secondary | ICD-10-CM | POA: Diagnosis not present

## 2016-11-01 LAB — COMPREHENSIVE METABOLIC PANEL
ALT: 58 U/L — ABNORMAL HIGH (ref 14–54)
AST: 46 U/L — AB (ref 15–41)
Albumin: 3.5 g/dL (ref 3.5–5.0)
Alkaline Phosphatase: 70 U/L (ref 38–126)
Anion gap: 6 (ref 5–15)
BUN: 5 mg/dL — ABNORMAL LOW (ref 6–20)
CHLORIDE: 107 mmol/L (ref 101–111)
CO2: 25 mmol/L (ref 22–32)
Calcium: 8.6 mg/dL — ABNORMAL LOW (ref 8.9–10.3)
Creatinine, Ser: 0.81 mg/dL (ref 0.44–1.00)
Glucose, Bld: 90 mg/dL (ref 65–99)
POTASSIUM: 4.1 mmol/L (ref 3.5–5.1)
SODIUM: 138 mmol/L (ref 135–145)
Total Bilirubin: 0.4 mg/dL (ref 0.3–1.2)
Total Protein: 6.7 g/dL (ref 6.5–8.1)

## 2016-11-01 LAB — CBC WITH DIFFERENTIAL/PLATELET
BASOS ABS: 0 10*3/uL (ref 0–0.1)
Basophils Relative: 1 %
EOS ABS: 0.1 10*3/uL (ref 0–0.7)
EOS PCT: 2 %
HCT: 36.3 % (ref 35.0–47.0)
Hemoglobin: 12 g/dL (ref 12.0–16.0)
LYMPHS PCT: 23 %
Lymphs Abs: 1.5 10*3/uL (ref 1.0–3.6)
MCH: 26.2 pg (ref 26.0–34.0)
MCHC: 33.2 g/dL (ref 32.0–36.0)
MCV: 78.9 fL — AB (ref 80.0–100.0)
MONO ABS: 0.6 10*3/uL (ref 0.2–0.9)
Monocytes Relative: 9 %
Neutro Abs: 4.3 10*3/uL (ref 1.4–6.5)
Neutrophils Relative %: 65 %
PLATELETS: 274 10*3/uL (ref 150–440)
RBC: 4.6 MIL/uL (ref 3.80–5.20)
RDW: 16.7 % — AB (ref 11.5–14.5)
WBC: 6.6 10*3/uL (ref 3.6–11.0)

## 2016-11-01 LAB — URINALYSIS COMPLETE WITH MICROSCOPIC (ARMC ONLY)
BACTERIA UA: NONE SEEN
Bilirubin Urine: NEGATIVE
Glucose, UA: NEGATIVE mg/dL
HGB URINE DIPSTICK: NEGATIVE
KETONES UR: NEGATIVE mg/dL
LEUKOCYTES UA: NEGATIVE
NITRITE: NEGATIVE
PH: 6 (ref 5.0–8.0)
PROTEIN: NEGATIVE mg/dL
SPECIFIC GRAVITY, URINE: 1.008 (ref 1.005–1.030)

## 2016-11-01 LAB — POCT PREGNANCY, URINE: Preg Test, Ur: NEGATIVE

## 2016-11-01 MED ORDER — LORAZEPAM 2 MG/ML IJ SOLN
2.0000 mg | Freq: Once | INTRAMUSCULAR | Status: AC
  Administered 2016-11-01: 2 mg via INTRAMUSCULAR
  Filled 2016-11-01 (×2): qty 1

## 2016-11-01 MED ORDER — MIDAZOLAM HCL 2 MG/2ML IJ SOLN
INTRAMUSCULAR | Status: AC
  Administered 2016-11-01: 2 mg via INTRAVENOUS
  Filled 2016-11-01: qty 2

## 2016-11-01 MED ORDER — MIDAZOLAM HCL 2 MG/2ML IJ SOLN
2.0000 mg | Freq: Once | INTRAMUSCULAR | Status: AC
  Administered 2016-11-01: 2 mg via INTRAVENOUS

## 2016-11-01 MED ORDER — LORAZEPAM 1 MG PO TABS: 1.0000 mg | ORAL_TABLET | Freq: Two times a day (BID) | ORAL | 0 refills | Status: DC | PRN

## 2016-11-01 MED ORDER — LORAZEPAM 2 MG/ML IJ SOLN
2.0000 mg | Freq: Once | INTRAMUSCULAR | Status: DC
Start: 2016-11-01 — End: 2016-11-01

## 2016-11-01 NOTE — ED Provider Notes (Signed)
Time Seen: Approximately 1748 I have reviewed the triage notes  Chief Complaint: Seizures   History of Present Illness: Denise Valentine is a 45 y.o. female who presents via EMS with transported from a local [employer]. EMS reports that they were contacted for seizure activity patient had an apparent fall and quite possibly hit the back of her head. Eyes any neck pain and arrives with c-collar in place. She complains of a headache mainly over the back of her head. Patient was recently seen and evaluated here with good extensive evaluation which showed possibly pseudoseizure. The patient does have an upcoming appointment with her primary physician and from there will receive neurology referral. The patient had a witnessed what appeared to myself and the nursing staff to be 2 episodes of a pseudoseizure. The patient doesn't have true tonic-clonic activity. She hit the stretcher with both her arms and her feet the same time and is able to stop upon command. Does not follow your time, urinate or defecate upon herself and there is no postictal episode.   Past Medical History:  Diagnosis Date  . Anxiety   . Asthma   . COPD (chronic obstructive pulmonary disease) (HCC)   . Depression   . GERD (gastroesophageal reflux disease)   . Hepatitis C   . Ovarian cyst   . Seizures [facility](HCC)     Patient Active Problem List   Diagnosis Date Noted  . Acute on chronic respiratory failure with hypoxia (HCC) 02/27/2016    Past Surgical History:  Procedure Laterality Date  . BACK SURGERY    . CHOLECYSTECTOMY    . TONSILLECTOMY    . TUBAL LIGATION    . WISDOM TOOTH EXTRACTION      Past Surgical History:  Procedure Laterality Date  . BACK SURGERY    . CHOLECYSTECTOMY    . TONSILLECTOMY    . TUBAL LIGATION    . WISDOM TOOTH EXTRACTION      Current Outpatient Rx  . Order #: [id number] Class: Historical Med  . Order #: [id number] Class: Print  . Order #: [id number] Class: Historical Med  . Order #:  [id number] Class: Historical Med  . Order #: [id number] Class: Historical Med  . Order #: [id number] Class: Print  . Order #: [id number] Class: Historical Med  . Order #: [id number] Class: Print  . Order #: [id number] Class: Historical Med  . Order #: [id number] Class: Normal  . Order #: [id number] Class: Print  . Order #: [id number] Class: Print  . Order #: [id number] Class: Print  . Order #: [id number] Class: Historical Med  . Order #: [id number] Class: Normal  . Order #: [id number] Class: Historical Med  . Order #: [id number] Class: Historical Med    Allergies:  Erythromycin  Family History: Family History  Problem Relation Age of Onset  . CAD Mother     Social History: Social History  Substance Use Topics  . Smoking status: Former Smoker    Types: Cigarettes    Quit date: 09/17/2016  . Smokeless tobacco: Never Used  . Alcohol use No     Review of Systems:   10 point review of systems was performed and was otherwise negative:  Constitutional: No fever Eyes: No visual disturbances ENT: No sore throat, ear pain Cardiac: No chest pain Respiratory: No shortness of breath, wheezing, or stridor Abdomen: No abdominal pain, no vomiting, No diarrhea Endocrine: No weight loss, No night sweats Extremities: No peripheral edema, cyanosis Skin: No rashes, easy bruising Neurologic: No focal weakness, trouble with speech or swollowing Urologic: No dysuria, Hematuria, or urinary frequency  Physical Exam:  ED Triage Vitals  Enc Vitals Group     BP 11/01/16 1727 112/64     Pulse Rate 11/01/16 1727 76     Resp 11/01/16 1727 20     Temp 11/01/16 1727 98.5 F (36.9 C)     Temp Source 11/01/16 1727 Oral     SpO2 11/01/16 1720 95 %     Weight 11/01/16 1728 200 lb (90.7 kg)     Height 11/01/16 1728 5\' 7"  (1.702 m)     Head Circumference --      Peak Flow --      Pain Score 11/01/16 1728 8     Pain Loc --      Pain Edu? --      Excl. in GC? --     General: Awake , Alert , and Oriented times 3;  GCS 15 Head: Normal cephalic , atraumatic Eyes: Pupils equal , round, reactive to light Nose/Throat: No nasal drainage, patent upper airway without erythema or exudate.  Neck: Supple, Full range of motion, No anterior adenopathy or palpable thyroid masses Lungs: Clear to ascultation without wheezes , rhonchi, or rales Heart: Regular rate, regular rhythm without murmurs , gallops , or rubs Abdomen: Soft, non tender without rebound, guarding , or rigidity; bowel sounds positive and symmetric in all 4 quadrants. No organomegaly .        Extremities: 2 plus symmetric pulses. No edema, clubbing or cyanosis Neurologic: normal ambulation, Motor symmetric without deficits, sensory intact Skin: warm, dry, no rashes   Labs:   All laboratory work was reviewed including any pertinent negatives or positives listed below:  Labs Reviewed  CBC WITH DIFFERENTIAL/PLATELET - Abnormal; Notable for the following:       Result Value   MCV 78.9 (*)    RDW 16.7 (*)    All other components within normal limits  COMPREHENSIVE METABOLIC PANEL - Abnormal; Notable for the following:    BUN 5 (*)    Calcium 8.6 (*)    AST 46 (*)    ALT 58 (*)    All other components within normal limits  URINALYSIS COMPLETEWITH MICROSCOPIC ([facility] ONLY) - Abnormal; Notable for the following:    Color, Urine YELLOW (*)    APPearance CLEAR (*)    Squamous Epithelial / LPF 0-5 (*)    All other components within normal limits  POC URINE PREG, ED  POCT PREGNANCY, URINE  Laboratory work was reviewed and showed no clinically significant abnormalities.   Radiology:  "Dg Chest 1 View  Result Date: 10/25/2016 CLINICAL DATA:  Acute headache with seizure.  Concern for mass. EXAM: CHEST 1 VIEW COMPARISON:  08/31/2016 FINDINGS: Chronic interstitial coarsening correlating with emphysematous changes and airway thickening on September 2017 chest CT. Pulmonary nodules reported by CT are not well seen by radiography. Normal heart size and  mediastinal contours. Right cervical rib. IMPRESSION: Stable from prior.  No evidence of acute disease. Electronically Signed   By: Marnee Spring M.D.   On: 10/25/2016 17:18   Ct Head Wo Contrast  Result Date: 11/01/2016 CLINICAL DATA:  Patient had a seizure. Bystanders to exposed skin bold report patient fell hitting head. EXAM: CT HEAD WITHOUT CONTRAST TECHNIQUE: Contiguous axial images were obtained from the base of the skull through the vertex without intravenous contrast. COMPARISON:  10/25/2016 FINDINGS: BRAIN: The ventricles and sulci are normal. No intraparenchymal hemorrhage, mass effect nor midline shift. No acute large vascular territory infarcts. Grey-white matter distinction is maintained.  The basal ganglia are unremarkable. No abnormal extra-axial fluid collections. Basal cisterns are patent. The brainstem and cerebellar hemispheres are without acute abnormalities. VASCULAR: Unremarkable. SKULL/SOFT TISSUES: No skull fracture. No significant soft tissue swelling. ORBITS/SINUSES: The included ocular globes and orbital contents are normal.The mastoid air cells are clear. The included paranasal sinuses are well-aerated. OTHER: None. IMPRESSION: No acute intracranial abnormality.  No acute osseous abnormality. Electronically Signed   By: Tollie Ethavid  Kwon M.D.   On: 11/01/2016 19:25   Ct Head W Or Wo Contrast  Result Date: 10/25/2016 CLINICAL DATA:  Acute headache with new onset seizure. Multiple other recent headaches. EXAM: CT HEAD WITHOUT AND WITH CONTRAST TECHNIQUE: Contiguous axial images were obtained from the base of the skull through the vertex without and with intravenous contrast CONTRAST:  75mL ISOVUE-300 IOPAMIDOL (ISOVUE-300) INJECTION 61% COMPARISON:  None. FINDINGS: Brain: No acute infarct, hemorrhage, or mass lesion is present. The ventricles are of normal size. No significant extraaxial fluid collection is present. The brainstem and cerebellum are within normal limits. Postcontrast  images demonstrate no pathologic enhancement. Vascular: No hyperdense vessel or unexpected calcification. Visible vessels are patent. Skull: The calvarium is intact. Sinuses/Orbits: The paranasal sinuses and mastoid air cells are clear. The visualize globes and orbits are intact. IMPRESSION: Negative CT of the head without and with contrast. Electronically Signed   By: Marin Robertshristopher  Mattern M.D.   On: 10/25/2016 18:36  " I personally reviewed the radiologic studies Patient had a repeat head CT secondary to head trauma.   ED Course:  Patient's stay here was uneventful and she received IM Ativan with resolution of her symptoms. The patient was advised the need to continue follow-up and continue with her outpatient evaluation for what appears to us to be pseudoseizures. This was explained both to the patient and her husband with her consent. The patient should continue with her neurology evaluation to make sure that there is no evidence of a true seizure and to continue her current outpatient management. Patient was given a prescription for Ativan mainly for anxiety, etc. Clinical Course      Assessment:  Pseudoseizures  Final Clinical Impression:  Final diagnoses:  Pseudoseizure     Plan:  Outpatient Patient was advised to return immediately if condition worsens. Patient was advised to follow up with their primary care physician or other specialized physicians involved in their outpatient care. The patient and/or family member/power of attorney had laboratory results reviewed at the bedside. All questions and concerns were addressed and appropriate discharge instructions were distributed by the nursing staff.            Jennye MoccasinBrian S Quigley, MD 11/02/16 [phone]

## 2016-11-01 NOTE — ED Notes (Addendum)
Pt had seizure, hands gripped to side rails. Pt moved immediately to room 10. Pt placed on N L nasal cannula. Pt awake after seizure, grunting.

## 2016-11-01 NOTE — ED Notes (Signed)
Husband at bedside.  

## 2016-11-01 NOTE — ED Triage Notes (Signed)
Pt in via EMS; called out due to seizure while at [employer].  Pt A/O upon EMS arrival, no LOC, no post-ictal state.  Bystanders report fall with pt hitting head.  Pt with hx of pseudoseizures.  Pt A/Ox4, complaints of headache upon arrival.  No immediate distress at this time.

## 2016-11-01 NOTE — Discharge Instructions (Signed)
Please return immediately if condition worsens. Please contact her primary physician or the physician you were given for referral. If you have any specialist physicians involved in her treatment and plan please also contact them. Thank you for using [facility]. ° °

## 2016-11-06 ENCOUNTER — Emergency Department
Admission: EM | Admit: 2016-11-06 | Discharge: 2016-11-06 | Disposition: A | Discharge status: Home / Self Care | Payer: Medicaid Other

## 2016-11-06 NOTE — ED Notes (Addendum)
Pt with family and requesting to leave, pt reports she feels fine now. Pt removed c- collar that was placed by ems. Pt reports she wants to go to [facility] instead. Pt advised that she should stay for evaluation by MD but refused.

## 2017-05-24 ENCOUNTER — Emergency Department
Admission: EM | Admit: 2017-05-24 | Discharge: 2017-05-24 | Disposition: A | Discharge status: Home / Self Care | Payer: Medicaid Other | Attending: Emergency Medicine | Admitting: Emergency Medicine

## 2017-05-24 ENCOUNTER — Emergency Department: Payer: Medicaid Other

## 2017-05-24 ENCOUNTER — Encounter: Payer: Self-pay | Admitting: *Deleted

## 2017-05-24 DIAGNOSIS — M25562 Pain in left knee: Secondary | ICD-10-CM | POA: Insufficient documentation

## 2017-05-24 DIAGNOSIS — Z87891 Personal history of nicotine dependence: Secondary | ICD-10-CM | POA: Diagnosis not present

## 2017-05-24 DIAGNOSIS — J449 Chronic obstructive pulmonary disease, unspecified: Secondary | ICD-10-CM | POA: Insufficient documentation

## 2017-05-24 DIAGNOSIS — Z79899 Other long term (current) drug therapy: Secondary | ICD-10-CM | POA: Diagnosis not present

## 2017-05-24 NOTE — ED Notes (Signed)
See triage note  Developed pain to lateral left knee a few days ago  Denies any fall  But may have twisted knee  Pain became worse today

## 2017-05-24 NOTE — ED Provider Notes (Signed)
[facility] Emergency Department Provider Note  ____________________________________________  Time seen: Approximately 11:47 AM  I have reviewed the triage vital signs and the nursing notes.   HISTORY  Chief Complaint Knee Pain    HPI Denise Valentine is a 46 y.o. female that presents to the emergency department with left knee pain for 3 days. Patient states that pain is primarily on the front and middle of her knee. Pain does not radiate. Pain is worse with walking. No trauma. She is taking ibuprofen for pain. No fever, shortness of breath, chest pain, nausea, vomiting, abdominal pain, calf pain, numbness, tingling.   Past Medical History:  Diagnosis Date  . Anxiety   . Asthma   . COPD (chronic obstructive pulmonary disease) (HCC)   . Depression   . GERD (gastroesophageal reflux disease)   . Hepatitis C   . Ovarian cyst   . Seizures [facility])     Patient Active Problem List   Diagnosis Date Noted  . Acute on chronic respiratory failure with hypoxia (HCC) 02/27/2016    Past Surgical History:  Procedure Laterality Date  . BACK SURGERY    . CHOLECYSTECTOMY    . TONSILLECTOMY    . TUBAL LIGATION    . WISDOM TOOTH EXTRACTION      Prior to Admission medications   Medication Sig Start Date End Date Taking? Authorizing Provider  ADVAIR DISKUS 250-50 MCG/DOSE AEPB Inhale 1 puff into the lungs 2 (two) times daily. 02/17/16   [provider]  albuterol (PROVENTIL HFA;VENTOLIN HFA) 108 (90 Base) MCG/ACT inhaler Inhale 2 puffs into the lungs every 6 (six) hours as needed for wheezing or shortness of breath. 02/27/16   Gayla Doss, MD  albuterol (PROVENTIL) (2.5 MG/3ML) 0.083% nebulizer solution Inhale 3 mLs into the lungs every 6 (six) hours as needed. 02/17/16   [provider]  budesonide-formoterol (SYMBICORT) 160-4.5 MCG/ACT inhaler Inhale 2 puffs into the lungs 2 (two) times daily.    [provider]  buprenorphine-naloxone  (SUBOXONE) 8-2 MG SUBL SL tablet Place 1 tablet under the tongue 3 (three) times daily.    [provider]  ferrous sulfate (EQL SLOW RELEASE IRON) 160 (50 Fe) MG TBCR SR tablet Take 1 tablet (160 mg total) by mouth daily. 12/27/15   Emily Filbert, MD  FLUoxetine (PROZAC) 40 MG capsule Take 40 mg by mouth daily.    [provider]  furosemide (LASIX) 20 MG tablet Take 1 tablet (20 mg total) by mouth daily. 01/03/16 01/02/17  Arnaldo Natal, MD  gabapentin (NEURONTIN) 300 MG capsule Take 300 mg by mouth 3 (three) times daily.    [provider]  guaiFENesin (MUCINEX) 600 MG 12 hr tablet Take 1 tablet (600 mg total) by mouth 2 (two) times daily. 03/02/16   Auburn Bilberry, MD  guaiFENesin-codeine 100-10 MG/5ML syrup Take 10 mLs by mouth every 4 (four) hours as needed for cough. 03/02/16   Auburn Bilberry, MD  ibuprofen (ADVIL,MOTRIN) 800 MG tablet Take 1 tablet (800 mg total) by mouth every 8 (eight) hours as needed for moderate pain. 10/26/15   Joni Reining, PA-C  LORazepam (ATIVAN) 1 MG tablet Take 1 tablet (1 mg total) by mouth 2 (two) times daily as needed for anxiety. 11/01/16   Jennye Moccasin, MD  pantoprazole (PROTONIX) 40 MG tablet Take 40 mg by mouth daily.    [provider]  predniSONE (STERAPRED UNI-PAK 21 TAB) 10 MG (21) TBPK tablet Take 1 tablet (10  mg total) by mouth daily. 03/02/16   Auburn BilberryPatel, Shreyang, MD  spironolactone (ALDACTONE) 25 MG tablet Take 25 mg by mouth daily.    [provider]  traZODone (DESYREL) 100 MG tablet Take 100 mg by mouth at bedtime.    [provider]    Allergies Erythromycin  Family History  Problem Relation Age of Onset  . CAD Mother     Social History Social History  Substance Use Topics  . Smoking status: Former Smoker    Types: Cigarettes    Quit date: 09/17/2016  . Smokeless tobacco: Never Used  . Alcohol use No     Review of Systems  Constitutional: No  fever/chills Cardiovascular: No chest pain. Respiratory: No SOB. Gastrointestinal: No abdominal pain.  No nausea, no vomiting.  Musculoskeletal: Positive for knee pain. Skin: Negative for rash, abrasions, lacerations, ecchymosis. Neurological: Negative for headaches, numbness or tingling   ____________________________________________   PHYSICAL EXAM:  VITAL SIGNS: ED Triage Vitals  Enc Vitals Group     BP 05/24/17 1029 120/60     Pulse Rate 05/24/17 1029 81     Resp 05/24/17 1029 16     Temp 05/24/17 1029 98.1 F (36.7 C)     Temp Source 05/24/17 1029 Oral     SpO2 05/24/17 1029 98 %     Weight 05/24/17 1027 209 lb (94.8 kg)     Height 05/24/17 1027 5\' 7"  (1.702 m)     Head Circumference --      Peak Flow --      Pain Score 05/24/17 1027 8     Pain Loc --      Pain Edu? --      Excl. in GC? --      Constitutional: Alert and oriented. Well appearing and in no acute distress. Eyes: Conjunctivae are normal. PERRL. EOMI. Head: Atraumatic. ENT:      Ears:      Nose: No congestion/rhinnorhea.      Mouth/Throat: Mucous membranes are moist.  Neck: No stridor. Cardiovascular: Normal rate, regular rhythm.  Good peripheral circulation. Respiratory: Normal respiratory effort without tachypnea or retractions. Lungs CTAB. Good air entry to the bases with no decreased or absent breath sounds. Gastrointestinal: Bowel sounds 4 quadrants. Soft and nontender to palpation. No guarding or rigidity. No palpable masses. No distention.  Musculoskeletal: Full range of motion to all extremities. No gross deformities appreciated. No tenderness to palpation. No effusion noted. Pain with anterior drawer, posterior drawer, valgus, varus, mcMurray, and apley grind. Neurologic:  Normal speech and language. No gross focal neurologic deficits are appreciated.  Skin:  Skin is warm, dry and intact. No rash noted.   ____________________________________________   LABS (all labs ordered are listed,  but only abnormal results are displayed)  Labs Reviewed - No data to display ____________________________________________  EKG   ____________________________________________  RADIOLOGY Lexine BatonI, Yanira Tolsma, personally viewed and evaluated these images (plain radiographs) as part of my medical decision making, as well as reviewing the written report by the radiologist.  Dg Knee 2 Views Left  Result Date: 05/24/2017 CLINICAL DATA:  Knee pain today EXAM: LEFT KNEE - 1-2 VIEW COMPARISON:  None. FINDINGS: No acute fracture. No dislocation.  Unremarkable soft tissues. IMPRESSION: No acute bony pathology. Electronically Signed   By: Jolaine ClickArthur  Hoss M.D.   On: 05/24/2017 11:31    ____________________________________________    PROCEDURES  Procedure(s) performed:    Procedures    Medications - No data to display   ____________________________________________  INITIAL IMPRESSION / ASSESSMENT AND PLAN / ED COURSE  Pertinent labs & imaging results that were available during my care of the patient were reviewed by me and considered in my medical decision making (see chart for details).  Review of the [place] CSRS was performed in accordance of the [facility] prior to dispensing any controlled drugs.   Patient presented to the emergency department knee pain for 3 days. Vital signs and exam are reassuring. X-ray negative for acute bony abnormalities. No trauma and no calf pain. Ace wrap and crutches were given. Patient is going to follow up with PCP. Education about RICE precautions was provided. Patient is to follow up with PCP as directed. Patient is given ED precautions to return to the ED for any worsening or new symptoms.     ____________________________________________  FINAL CLINICAL IMPRESSION(S) / ED DIAGNOSES  Final diagnoses:  Acute pain of left knee      NEW MEDICATIONS STARTED DURING THIS VISIT:  Discharge Medication List as of 05/24/2017 12:09 PM          This chart  was dictated using voice recognition software/Dragon. Despite best efforts to proofread, errors can occur which can change the meaning. Any change was purely unintentional.    Enid Derry, PA-C 05/24/17 1503    Sharman Cheek, MD 05/26/17 2022

## 2017-05-24 NOTE — ED Triage Notes (Signed)
States left knee pain for several days, denies any known injury, states she feels as if there is fluid on her knee

## 2017-06-19 ENCOUNTER — Emergency Department
Admission: EM | Admit: 2017-06-19 | Discharge: 2017-06-19 | Disposition: A | Discharge status: Home / Self Care | Payer: Medicaid Other | Attending: Emergency Medicine | Admitting: Emergency Medicine

## 2017-06-19 ENCOUNTER — Encounter: Payer: Self-pay | Admitting: *Deleted

## 2017-06-19 DIAGNOSIS — N946 Dysmenorrhea, unspecified: Secondary | ICD-10-CM | POA: Insufficient documentation

## 2017-06-19 DIAGNOSIS — M545 Low back pain: Secondary | ICD-10-CM | POA: Diagnosis not present

## 2017-06-19 DIAGNOSIS — R300 Dysuria: Secondary | ICD-10-CM | POA: Diagnosis not present

## 2017-06-19 DIAGNOSIS — E876 Hypokalemia: Secondary | ICD-10-CM | POA: Insufficient documentation

## 2017-06-19 DIAGNOSIS — Z87891 Personal history of nicotine dependence: Secondary | ICD-10-CM | POA: Diagnosis not present

## 2017-06-19 DIAGNOSIS — J449 Chronic obstructive pulmonary disease, unspecified: Secondary | ICD-10-CM | POA: Insufficient documentation

## 2017-06-19 DIAGNOSIS — J45909 Unspecified asthma, uncomplicated: Secondary | ICD-10-CM | POA: Insufficient documentation

## 2017-06-19 DIAGNOSIS — R109 Unspecified abdominal pain: Secondary | ICD-10-CM | POA: Diagnosis present

## 2017-06-19 LAB — URINALYSIS, COMPLETE (UACMP) WITH MICROSCOPIC
Bacteria, UA: NONE SEEN
Bilirubin Urine: NEGATIVE
Glucose, UA: NEGATIVE mg/dL
Hgb urine dipstick: NEGATIVE
Ketones, ur: NEGATIVE mg/dL
Leukocytes, UA: NEGATIVE
Nitrite: NEGATIVE
PH: 6 (ref 5.0–8.0)
Protein, ur: NEGATIVE mg/dL
SPECIFIC GRAVITY, URINE: 1.019 (ref 1.005–1.030)

## 2017-06-19 LAB — BASIC METABOLIC PANEL
Anion gap: 10 (ref 5–15)
BUN: 10 mg/dL (ref 6–20)
CHLORIDE: 100 mmol/L — AB (ref 101–111)
CO2: 26 mmol/L (ref 22–32)
CREATININE: 0.79 mg/dL (ref 0.44–1.00)
Calcium: 9.4 mg/dL (ref 8.9–10.3)
GFR calc Af Amer: >60 mL/min (ref >60)
GFR calc non Af Amer: >60 mL/min (ref >60)
GLUCOSE: 147 mg/dL — AB (ref 65–99)
Potassium: 2.7 mmol/L — CL (ref 3.5–5.1)
SODIUM: 136 mmol/L (ref 135–145)

## 2017-06-19 LAB — CBC
HCT: 46.3 % (ref 35.0–47.0)
Hemoglobin: 15.2 g/dL (ref 12.0–16.0)
MCH: 25.2 pg — AB (ref 26.0–34.0)
MCHC: 32.8 g/dL (ref 32.0–36.0)
MCV: 76.9 fL — ABNORMAL LOW (ref 80.0–100.0)
PLATELETS: 349 10*3/uL (ref 150–440)
RBC: 6.02 MIL/uL — ABNORMAL HIGH (ref 3.80–5.20)
RDW: 16 % — AB (ref 11.5–14.5)
WBC: 9.1 10*3/uL (ref 3.6–11.0)

## 2017-06-19 MED ORDER — ONDANSETRON 4 MG PO TBDP: 4.0000 mg | ORAL_TABLET | Freq: Three times a day (TID) | ORAL | 0 refills | Status: DC | PRN

## 2017-06-19 MED ORDER — POTASSIUM CHLORIDE ER 10 MEQ PO TBCR: 10.0000 meq | EXTENDED_RELEASE_TABLET | Freq: Every day | ORAL | 0 refills | Status: DC

## 2017-06-19 MED ORDER — POTASSIUM CHLORIDE CRYS ER 20 MEQ PO TBCR
EXTENDED_RELEASE_TABLET | ORAL | Status: AC
  Administered 2017-06-19: 60 meq via ORAL
  Filled 2017-06-19: qty 3

## 2017-06-19 MED ORDER — PHENAZOPYRIDINE HCL 200 MG PO TABS
200.0000 mg | ORAL_TABLET | Freq: Once | ORAL | Status: AC
  Administered 2017-06-19: 200 mg via ORAL
  Filled 2017-06-19 (×2): qty 1

## 2017-06-19 MED ORDER — POTASSIUM CHLORIDE CRYS ER 20 MEQ PO TBCR
60.0000 meq | EXTENDED_RELEASE_TABLET | Freq: Once | ORAL | Status: AC
  Administered 2017-06-19: 60 meq via ORAL

## 2017-06-19 MED ORDER — ONDANSETRON 4 MG PO TBDP
4.0000 mg | ORAL_TABLET | Freq: Once | ORAL | Status: AC
Start: 2017-06-19 — End: 2017-06-19
  Administered 2017-06-19: 4 mg via ORAL
  Filled 2017-06-19: qty 1

## 2017-06-19 MED ORDER — PHENAZOPYRIDINE HCL 200 MG PO TABS: 200.0000 mg | ORAL_TABLET | Freq: Three times a day (TID) | ORAL | 0 refills | Status: AC | PRN

## 2017-06-19 NOTE — ED Provider Notes (Signed)
[facility] Emergency Department Provider Note       Time seen: ----------------------------------------- 3:55 PM on 06/19/2017 -----------------------------------------     I have reviewed the triage vital signs and the nursing notes.   HISTORY   Chief Complaint Dysuria    HPI Denise Valentine is a 46 y.o. female who presents to the ED for her abdominal pain, dysuria and low back pain. She's also had some nausea, reports she's currently on her menses. She denies fevers, chills, chest pain, shortness of breath, vomiting or diarrhea.   Past Medical History:  Diagnosis Date  . Anxiety   . Asthma   . COPD (chronic obstructive pulmonary disease) (HCC)   . Depression   . GERD (gastroesophageal reflux disease)   . Hepatitis C   . Ovarian cyst   . Seizures [facility])     Patient Active Problem List   Diagnosis Date Noted  . Acute on chronic respiratory failure with hypoxia (HCC) 02/27/2016    Past Surgical History:  Procedure Laterality Date  . BACK SURGERY    . CHOLECYSTECTOMY    . TONSILLECTOMY    . TUBAL LIGATION    . WISDOM TOOTH EXTRACTION      Allergies Erythromycin  Social History Social History  Substance Use Topics  . Smoking status: Former Smoker    Types: Cigarettes    Quit date: 09/17/2016  . Smokeless tobacco: Never Used  . Alcohol use No    Review of Systems Constitutional: Negative for fever. Eyes: Negative for vision changes ENT:  Negative for congestion, sore throat Cardiovascular: Negative for chest pain. Respiratory: Negative for shortness of breath. Gastrointestinal: Positive for abdominal pain, nausea Genitourinary: Positive for dysuria Musculoskeletal: Positive for back pain Skin: Negative for rash. Neurological: Negative for headaches, focal weakness or numbness.  All systems negative/normal/unremarkable except as stated in the HPI  ____________________________________________   PHYSICAL EXAM:  VITAL  SIGNS: ED Triage Vitals  Enc Vitals Group     BP 06/19/17 1513 137/84     Pulse Rate 06/19/17 1513 83     Resp 06/19/17 1513 20     Temp 06/19/17 1513 98.3 F (36.8 C)     Temp Source 06/19/17 1513 Oral     SpO2 06/19/17 1513 99 %     Weight 06/19/17 1513 200 lb (90.7 kg)     Height 06/19/17 1513 5\' 7"  (1.702 m)     Head Circumference --      Peak Flow --      Pain Score 06/19/17 1511 6     Pain Loc --      Pain Edu? --      Excl. in GC? --     Constitutional: Alert and oriented. Well appearing and in no distress. Eyes: Conjunctivae are normal. Normal extraocular movements. ENT   Head: Normocephalic and atraumatic.   Nose: No congestion/rhinnorhea.   Mouth/Throat: Mucous membranes are moist.   Neck: No stridor. Cardiovascular: Normal rate, regular rhythm. No murmurs, rubs, or gallops. Respiratory: Normal respiratory effort without tachypnea nor retractions. Breath sounds are clear and equal bilaterally. No wheezes/rales/rhonchi. Gastrointestinal: Soft and nontender. Normal bowel sounds Musculoskeletal: Nontender with normal range of motion in extremities. No lower extremity tenderness nor edema. Neurologic:  Normal speech and language. No gross focal neurologic deficits are appreciated.  Skin:  Skin is warm, dry and intact. No rash noted. Psychiatric: Depressed mood and affect ____________________________________________  ED COURSE:  Pertinent labs & imaging results that were available during my care  of the patient were reviewed by me and considered in my medical decision making (see chart for details). Patient presents for dysmenorrhea, we will assess with labs and imaging as indicated.   Procedures ____________________________________________   LABS (pertinent positives/negatives)  Labs Reviewed  BASIC METABOLIC PANEL - Abnormal; Notable for the following:       Result Value   Potassium 2.7 (*)    Chloride 100 (*)    Glucose, Bld 147 (*)    All other  components within normal limits  CBC - Abnormal; Notable for the following:    RBC 6.02 (*)    MCV 76.9 (*)    MCH 25.2 (*)    RDW 16.0 (*)    All other components within normal limits  URINALYSIS, COMPLETE (UACMP) WITH MICROSCOPIC - Abnormal; Notable for the following:    Color, Urine YELLOW (*)    APPearance CLEAR (*)    Squamous Epithelial / LPF 0-5 (*)    All other components within normal limits  ____________________________________________  FINAL ASSESSMENT AND PLAN  Dysmenorrhea, Hypokalemia  Plan: Patient's labs were dictated above. Patient had presented for general ill feeling with dysuria. She is on her menses currently, labs were positive only for hypokalemia. Otherwise her tests here were negative. She was given oral potassium and will need a prescription for same. I advised stopping Lasix for today and tomorrow. She is stable for outpatient follow-up.   Emily FilbertWilliams, Merl Bommarito E, MD   Note: This note was generated in part or whole with voice recognition software. Voice recognition is usually quite accurate but there are transcription errors that can and very often do occur. I apologize for any typographical errors that were not detected and corrected.     Emily FilbertWilliams, Nashea Chumney E, MD 06/19/17 [phone]

## 2017-06-19 NOTE — ED Triage Notes (Signed)
Pt has low abd pain, dysuria and low back pain.  Pt reports nausea.  Menses now.  Pt alert.

## 2018-05-08 ENCOUNTER — Encounter: Payer: Self-pay | Admitting: Emergency Medicine

## 2018-05-08 ENCOUNTER — Other Ambulatory Visit: Payer: Self-pay

## 2018-05-08 ENCOUNTER — Emergency Department
Admission: EM | Admit: 2018-05-08 | Discharge: 2018-05-08 | Disposition: A | Discharge status: Home / Self Care | Payer: Medicaid Other | Attending: Emergency Medicine | Admitting: Emergency Medicine

## 2018-05-08 ENCOUNTER — Emergency Department: Payer: Medicaid Other

## 2018-05-08 DIAGNOSIS — R112 Nausea with vomiting, unspecified: Secondary | ICD-10-CM | POA: Diagnosis present

## 2018-05-08 DIAGNOSIS — J449 Chronic obstructive pulmonary disease, unspecified: Secondary | ICD-10-CM | POA: Insufficient documentation

## 2018-05-08 DIAGNOSIS — K529 Noninfective gastroenteritis and colitis, unspecified: Secondary | ICD-10-CM | POA: Insufficient documentation

## 2018-05-08 DIAGNOSIS — Z79899 Other long term (current) drug therapy: Secondary | ICD-10-CM | POA: Diagnosis not present

## 2018-05-08 DIAGNOSIS — R05 Cough: Secondary | ICD-10-CM

## 2018-05-08 DIAGNOSIS — F17210 Nicotine dependence, cigarettes, uncomplicated: Secondary | ICD-10-CM | POA: Insufficient documentation

## 2018-05-08 DIAGNOSIS — R059 Cough, unspecified: Secondary | ICD-10-CM

## 2018-05-08 LAB — URINALYSIS, COMPLETE (UACMP) WITH MICROSCOPIC
BACTERIA UA: NONE SEEN
Bilirubin Urine: NEGATIVE
Glucose, UA: NEGATIVE mg/dL
HGB URINE DIPSTICK: NEGATIVE
Ketones, ur: NEGATIVE mg/dL
Leukocytes, UA: NEGATIVE
Nitrite: NEGATIVE
PROTEIN: NEGATIVE mg/dL
Specific Gravity, Urine: 1.009 (ref 1.005–1.030)
pH: 5 (ref 5.0–8.0)

## 2018-05-08 LAB — CBC
HCT: 42.7 % (ref 35.0–47.0)
HEMOGLOBIN: 14 g/dL (ref 12.0–16.0)
MCH: 26.3 pg (ref 26.0–34.0)
MCHC: 32.8 g/dL (ref 32.0–36.0)
MCV: 80 fL (ref 80.0–100.0)
Platelets: 293 10*3/uL (ref 150–440)
RBC: 5.33 MIL/uL — AB (ref 3.80–5.20)
RDW: 16.3 % — ABNORMAL HIGH (ref 11.5–14.5)
WBC: 5.9 10*3/uL (ref 3.6–11.0)

## 2018-05-08 LAB — COMPREHENSIVE METABOLIC PANEL
ALBUMIN: 3.5 g/dL (ref 3.5–5.0)
ALT: 9 U/L — AB (ref 14–54)
ANION GAP: 5 (ref 5–15)
AST: 16 U/L (ref 15–41)
Alkaline Phosphatase: 81 U/L (ref 38–126)
BUN: 6 mg/dL (ref 6–20)
CHLORIDE: 106 mmol/L (ref 101–111)
CO2: 24 mmol/L (ref 22–32)
Calcium: 8.3 mg/dL — ABNORMAL LOW (ref 8.9–10.3)
Creatinine, Ser: 0.64 mg/dL (ref 0.44–1.00)
GFR calc non Af Amer: >60 mL/min (ref >60)
Glucose, Bld: 103 mg/dL — ABNORMAL HIGH (ref 65–99)
Potassium: 4.1 mmol/L (ref 3.5–5.1)
SODIUM: 135 mmol/L (ref 135–145)
Total Bilirubin: 0.3 mg/dL (ref 0.3–1.2)
Total Protein: 6.8 g/dL (ref 6.5–8.1)

## 2018-05-08 LAB — LIPASE, BLOOD: LIPASE: 23 U/L (ref 11–51)

## 2018-05-08 MED ORDER — SODIUM CHLORIDE 0.9 % IV BOLUS
1000.0000 mL | Freq: Once | INTRAVENOUS | Status: AC
  Administered 2018-05-08: 1000 mL via INTRAVENOUS

## 2018-05-08 MED ORDER — ONDANSETRON HCL 4 MG/2ML IJ SOLN
4.0000 mg | Freq: Once | INTRAMUSCULAR | Status: AC
  Administered 2018-05-08: 4 mg via INTRAVENOUS
  Filled 2018-05-08: qty 2

## 2018-05-08 MED ORDER — ONDANSETRON HCL 4 MG PO TABS: 4.0000 mg | ORAL_TABLET | Freq: Three times a day (TID) | ORAL | 0 refills | Status: DC | PRN

## 2018-05-08 MED ORDER — IPRATROPIUM-ALBUTEROL 0.5-2.5 (3) MG/3ML IN SOLN
3.0000 mL | Freq: Once | RESPIRATORY_TRACT | Status: AC
  Administered 2018-05-08: 3 mL via RESPIRATORY_TRACT
  Filled 2018-05-08: qty 3

## 2018-05-08 NOTE — Discharge Instructions (Addendum)
Return to the emergency room for any new or worsening symptoms follow closely with your doctor

## 2018-05-08 NOTE — ED Provider Notes (Signed)
[facility] Emergency Department Provider Note  ____________________________________________   I have reviewed the triage vital signs and the nursing notes. Where available I have reviewed prior notes and, if possible and indicated, outside hospital notes.    HISTORY  Chief Complaint Dizziness; Abdominal Pain; Nausea; and Emesis    HPI Cortina Vultaggio is a 47 y.o. female with a history of COPD, pseudoseizures, reflux disease tobacco abuse ovarian cyst, asthma, seizures, who presents today complaining of nausea vomiting diarrhea which her mother also had, as well as a slight cough.  She denies any fever or chills.  She denies any melena or bright red blood per rectum or hematemesis.  She has mild diffuse abdominal discomfort worse with vomiting better with relaxing.  No other alleviating or aggravating symptoms no other concerns.  No productive cough.      Past Medical History:  Diagnosis Date  . Anxiety   . Asthma   . COPD (chronic obstructive pulmonary disease) (HCC)   . Depression   . GERD (gastroesophageal reflux disease)   . Hepatitis C   . Ovarian cyst   . Seizures [facility])     Patient Active Problem List   Diagnosis Date Noted  . Acute on chronic respiratory failure with hypoxia (HCC) 02/27/2016    Past Surgical History:  Procedure Laterality Date  . BACK SURGERY    . CHOLECYSTECTOMY    . TONSILLECTOMY    . TUBAL LIGATION    . WISDOM TOOTH EXTRACTION      Prior to Admission medications   Medication Sig Start Date End Date Taking? Authorizing Provider  ADVAIR DISKUS 250-50 MCG/DOSE AEPB Inhale 1 puff into the lungs 2 (two) times daily. 02/17/16   [provider]  albuterol (PROVENTIL HFA;VENTOLIN HFA) 108 (90 Base) MCG/ACT inhaler Inhale 2 puffs into the lungs every 6 (six) hours as needed for wheezing or shortness of breath. 02/27/16   Gayla Doss, MD  albuterol (PROVENTIL) (2.5 MG/3ML) 0.083% nebulizer solution Inhale 3 mLs into the  lungs every 6 (six) hours as needed. 02/17/16   [provider]  budesonide-formoterol (SYMBICORT) 160-4.5 MCG/ACT inhaler Inhale 2 puffs into the lungs 2 (two) times daily.    [provider]  buprenorphine-naloxone (SUBOXONE) 8-2 MG SUBL SL tablet Place 1 tablet under the tongue 3 (three) times daily.    [provider]  ferrous sulfate (EQL SLOW RELEASE IRON) 160 (50 Fe) MG TBCR SR tablet Take 1 tablet (160 mg total) by mouth daily. 12/27/15   Emily Filbert, MD  FLUoxetine (PROZAC) 40 MG capsule Take 40 mg by mouth daily.    [provider]  furosemide (LASIX) 20 MG tablet Take 1 tablet (20 mg total) by mouth daily. 01/03/16 01/02/17  Arnaldo Natal, MD  gabapentin (NEURONTIN) 300 MG capsule Take 300 mg by mouth 3 (three) times daily.    [provider]  guaiFENesin (MUCINEX) 600 MG 12 hr tablet Take 1 tablet (600 mg total) by mouth 2 (two) times daily. 03/02/16   Auburn Bilberry, MD  guaiFENesin-codeine 100-10 MG/5ML syrup Take 10 mLs by mouth every 4 (four) hours as needed for cough. 03/02/16   Auburn Bilberry, MD  ibuprofen (ADVIL,MOTRIN) 800 MG tablet Take 1 tablet (800 mg total) by mouth every 8 (eight) hours as needed for moderate pain. 10/26/15   Joni Reining, PA-C  LORazepam (ATIVAN) 1 MG tablet Take 1 tablet (1 mg total) by mouth 2 (two) times daily as needed for anxiety. 11/01/16  Jennye Moccasin, MD  ondansetron (ZOFRAN ODT) 4 MG disintegrating tablet Take 1 tablet (4 mg total) by mouth every 8 (eight) hours as needed for nausea or vomiting. 06/19/17   Emily Filbert, MD  pantoprazole (PROTONIX) 40 MG tablet Take 40 mg by mouth daily.    [provider]  phenazopyridine (PYRIDIUM) 200 MG tablet Take 1 tablet (200 mg total) by mouth 3 (three) times daily as needed for pain. 06/19/17 06/19/18  Emily Filbert, MD  potassium chloride (K-DUR) 10 MEQ tablet Take 1 tablet (10 mEq total) by mouth daily. 06/19/17   Emily Filbert,  MD  predniSONE (STERAPRED UNI-PAK 21 TAB) 10 MG (21) TBPK tablet Take 1 tablet (10 mg total) by mouth daily. 03/02/16   Auburn Bilberry, MD  spironolactone (ALDACTONE) 25 MG tablet Take 25 mg by mouth daily.    [provider]  traZODone (DESYREL) 100 MG tablet Take 100 mg by mouth at bedtime.    [provider]    Allergies Erythromycin  Family History  Problem Relation Age of Onset  . CAD Mother     Social History Social History   Tobacco Use  . Smoking status: Current Every Day Smoker    Packs/day: 0.50    Types: Cigarettes    Last attempt to quit: 09/17/2016    Years since quitting: 1.6  . Smokeless tobacco: Never Used  Substance Use Topics  . Alcohol use: No  . Drug use: No    Review of Systems Constitutional: No fever/chills Eyes: No visual changes. ENT: No sore throat. No stiff neck no neck pain Cardiovascular: Denies chest pain. Respiratory: Denies shortness of breath.  Positive mild cough Gastrointestinal: See HPI genitourinary: Negative for dysuria. Musculoskeletal: Negative lower extremity swelling Skin: Negative for rash. Neurological: Negative for severe headaches, focal weakness or numbness.   ____________________________________________   PHYSICAL EXAM:  VITAL SIGNS: ED Triage Vitals  Enc Vitals Group     BP 05/08/18 1257 123/71     Pulse Rate 05/08/18 1257 79     Resp 05/08/18 1257 14     Temp 05/08/18 1257 98 F (36.7 C)     Temp Source 05/08/18 1257 Oral     SpO2 05/08/18 1257 96 %     Weight 05/08/18 1301 170 lb (77.1 kg)     Height 05/08/18 1301  (1.702 m)     Head Circumference --      Peak Flow --      Pain Score 05/08/18 1301 6     Pain Loc --      Pain Edu? --      Excl. in GC? --     Constitutional: Alert and oriented. Well appearing and in no acute distress. Eyes: Conjunctivae are normal Head: Atraumatic HEENT: No congestion/rhinnorhea. Mucous membranes are moist.  Oropharynx non-erythematous Neck:    Nontender with no meningismus, no masses, no stridor Cardiovascular: Normal rate, regular rhythm. Grossly normal heart sounds.  Good peripheral circulation. Respiratory: Normal respiratory effort.  No retractions. Lungs CTAB. Abdominal: Soft and slight nonfocal nonsurgical diffuse discomfort which is distractible. No distention. No guarding no rebound Back:  There is no focal tenderness or step off.  there is no midline tenderness there are no lesions noted. there is no CVA tenderness Musculoskeletal: No lower extremity tenderness, no upper extremity tenderness. No joint effusions, no DVT signs strong distal pulses no edema Neurologic:  Normal speech and language. No gross focal neurologic deficits are appreciated.  Skin:  Skin is warm, dry and intact. No rash noted. Psychiatric: Mood and affect are normal. Speech and behavior are normal.  ____________________________________________   LABS (all labs ordered are listed, but only abnormal results are displayed)  Labs Reviewed  COMPREHENSIVE METABOLIC PANEL - Abnormal; Notable for the following components:      Result Value   Glucose, Bld 103 (*)    Calcium 8.3 (*)    ALT 9 (*)    All other components within normal limits  CBC - Abnormal; Notable for the following components:   RBC 5.33 (*)    RDW 16.3 (*)    All other components within normal limits  URINALYSIS, COMPLETE (UACMP) WITH MICROSCOPIC - Abnormal; Notable for the following components:   Color, Urine YELLOW (*)    APPearance CLEAR (*)    All other components within normal limits  LIPASE, BLOOD    Pertinent labs  results that were available during my care of the patient were reviewed by me and considered in my medical decision making (see chart for details). ____________________________________________  EKG  I personally interpreted any EKGs ordered by me or triage  ____________________________________________  RADIOLOGY  Pertinent labs & imaging results that were  available during my care of the patient were reviewed by me and considered in my medical decision making (see chart for details). If possible, patient and/or family made aware of any abnormal findings.  No results found. ____________________________________________    PROCEDURES  Procedure(s) performed: None  Procedures  Critical Care performed: None  ____________________________________________   INITIAL IMPRESSION / ASSESSMENT AND PLAN / ED COURSE  Pertinent labs & imaging results that were available during my care of the patient were reviewed by me and considered in my medical decision making (see chart for details).  Patient here with nausea vomiting diarrhea also complains of some slight cough lungs are actually clear I do not hear much wheezing rales or rhonchi we will get a chest x-ray blood work is reassuring, no evidence of dehydration no evidence of ketosis, no evidence of anion gap no evidence of impaired renal function, white count is normal and not anemic etc.  No evidence of pancreatitis.  We will give her IV fluids and nausea medication and is my hope to get her safely home.  Is a nonsurgical abdomen at this time.    ____________________________________________   FINAL CLINICAL IMPRESSION(S) / ED DIAGNOSES  Final diagnoses:  None      This chart was dictated using voice recognition software.  Despite best efforts to proofread,  errors can occur which can change meaning.      Jeanmarie Plant, MD 05/08/18 [phone]

## 2018-05-08 NOTE — ED Triage Notes (Signed)
Pt to ED from home c/o dizziness, n/v/d x10 yesterday and today, abd pain across lower left and right, denies urinary symptoms, states hx of COPD with worse SOB than normal.  Patient presents A&Ox4, chest rise even and unlabored, skin warm and dry.

## 2019-01-23 ENCOUNTER — Encounter: Payer: Self-pay | Admitting: Emergency Medicine

## 2019-01-23 ENCOUNTER — Emergency Department: Payer: Medicaid Other

## 2019-01-23 ENCOUNTER — Emergency Department
Admission: EM | Admit: 2019-01-23 | Discharge: 2019-01-23 | Disposition: A | Discharge status: Home / Self Care | Payer: Medicaid Other | Attending: Emergency Medicine | Admitting: Emergency Medicine

## 2019-01-23 DIAGNOSIS — J209 Acute bronchitis, unspecified: Secondary | ICD-10-CM | POA: Insufficient documentation

## 2019-01-23 DIAGNOSIS — J45909 Unspecified asthma, uncomplicated: Secondary | ICD-10-CM | POA: Diagnosis not present

## 2019-01-23 DIAGNOSIS — Z79899 Other long term (current) drug therapy: Secondary | ICD-10-CM | POA: Insufficient documentation

## 2019-01-23 DIAGNOSIS — F17210 Nicotine dependence, cigarettes, uncomplicated: Secondary | ICD-10-CM | POA: Insufficient documentation

## 2019-01-23 DIAGNOSIS — J441 Chronic obstructive pulmonary disease with (acute) exacerbation: Secondary | ICD-10-CM | POA: Diagnosis not present

## 2019-01-23 DIAGNOSIS — R0602 Shortness of breath: Secondary | ICD-10-CM | POA: Diagnosis present

## 2019-01-23 DIAGNOSIS — J40 Bronchitis, not specified as acute or chronic: Secondary | ICD-10-CM

## 2019-01-23 LAB — BASIC METABOLIC PANEL
ANION GAP: 6 (ref 5–15)
BUN: <5 mg/dL — ABNORMAL LOW (ref 6–20)
CO2: 29 mmol/L (ref 22–32)
Calcium: 8.6 mg/dL — ABNORMAL LOW (ref 8.9–10.3)
Chloride: 103 mmol/L (ref 98–111)
Creatinine, Ser: 0.59 mg/dL (ref 0.44–1.00)
Glucose, Bld: 120 mg/dL — ABNORMAL HIGH (ref 70–99)
POTASSIUM: 3.9 mmol/L (ref 3.5–5.1)
SODIUM: 138 mmol/L (ref 135–145)

## 2019-01-23 LAB — CBC
HEMATOCRIT: 44.4 % (ref 36.0–46.0)
HEMOGLOBIN: 14.2 g/dL (ref 12.0–15.0)
MCH: 26.5 pg (ref 26.0–34.0)
MCHC: 32 g/dL (ref 30.0–36.0)
MCV: 83 fL (ref 80.0–100.0)
NRBC: 0 % (ref 0.0–0.2)
Platelets: 277 10*3/uL (ref 150–400)
RBC: 5.35 MIL/uL — ABNORMAL HIGH (ref 3.87–5.11)
RDW: 14 % (ref 11.5–15.5)
WBC: 6.9 10*3/uL (ref 4.0–10.5)

## 2019-01-23 LAB — TROPONIN I

## 2019-01-23 MED ORDER — IPRATROPIUM-ALBUTEROL 0.5-2.5 (3) MG/3ML IN SOLN
3.0000 mL | Freq: Once | RESPIRATORY_TRACT | Status: AC
  Administered 2019-01-23: 3 mL via RESPIRATORY_TRACT
  Filled 2019-01-23: qty 3

## 2019-01-23 MED ORDER — DOXYCYCLINE HYCLATE 100 MG PO TABS: 100.0000 mg | ORAL_TABLET | Freq: Two times a day (BID) | ORAL | 0 refills | Status: AC

## 2019-01-23 MED ORDER — PREDNISONE 20 MG PO TABS: 40.0000 mg | ORAL_TABLET | Freq: Every day | ORAL | 0 refills | Status: DC

## 2019-01-23 MED ORDER — DOXYCYCLINE HYCLATE 100 MG PO TABS
100.0000 mg | ORAL_TABLET | Freq: Once | ORAL | Status: AC
  Administered 2019-01-23: 100 mg via ORAL
  Filled 2019-01-23: qty 1

## 2019-01-23 MED ORDER — PREDNISONE 20 MG PO TABS
60.0000 mg | ORAL_TABLET | Freq: Once | ORAL | Status: AC
  Administered 2019-01-23: 60 mg via ORAL
  Filled 2019-01-23: qty 3

## 2019-01-23 NOTE — ED Notes (Signed)
Pt states she has been taken Mucinex at home to relief the cough w/o success. Pt denies dizziness at this time. Per pt "I feel out of breath when I walk or move around" Denies CP.

## 2019-01-23 NOTE — ED Provider Notes (Signed)
[facility] Emergency Department Provider Note  Time seen: 7:25 PM  I have reviewed the triage vital signs and the nursing notes.   HISTORY  Chief Complaint Shortness of Breath    HPI Denise Valentine is a 48 y.o. female with a past medical history of anxiety, asthma, COPD, presents to the emergency department for cough, shortness of breath with mild chest discomfort.  According to the patient over the past 4 to 5 days she has had a productive cough, with worsening shortness of breath occasional chest discomfort worse with cough.  Patient has a history of COPD, has had pneumonia in the past as well believes her symptoms today are somewhat similar to past pneumonia.  Patient denies any known fever.  Denies any nausea vomiting, diarrhea, largely negative review of systems otherwise.   Past Medical History:  Diagnosis Date  . Anxiety   . Asthma   . COPD (chronic obstructive pulmonary disease) (HCC)   . Depression   . GERD (gastroesophageal reflux disease)   . Hepatitis C   . Ovarian cyst   . Seizures [facility])     Patient Active Problem List   Diagnosis Date Noted  . Acute on chronic respiratory failure with hypoxia (HCC) 02/27/2016    Past Surgical History:  Procedure Laterality Date  . BACK SURGERY    . CHOLECYSTECTOMY    . TONSILLECTOMY    . TUBAL LIGATION    . WISDOM TOOTH EXTRACTION      Prior to Admission medications   Medication Sig Start Date End Date Taking? Authorizing Provider  ADVAIR DISKUS 250-50 MCG/DOSE AEPB Inhale 1 puff into the lungs 2 (two) times daily. 02/17/16   [provider]  albuterol (PROVENTIL HFA;VENTOLIN HFA) 108 (90 Base) MCG/ACT inhaler Inhale 2 puffs into the lungs every 6 (six) hours as needed for wheezing or shortness of breath. 02/27/16   Gayla Doss, MD  albuterol (PROVENTIL) (2.5 MG/3ML) 0.083% nebulizer solution Inhale 3 mLs into the lungs every 6 (six) hours as needed. 02/17/16   [provider]   budesonide-formoterol (SYMBICORT) 160-4.5 MCG/ACT inhaler Inhale 2 puffs into the lungs 2 (two) times daily.    [provider]  buprenorphine-naloxone (SUBOXONE) 8-2 MG SUBL SL tablet Place 1 tablet under the tongue 3 (three) times daily.    [provider]  ferrous sulfate (EQL SLOW RELEASE IRON) 160 (50 Fe) MG TBCR SR tablet Take 1 tablet (160 mg total) by mouth daily. 12/27/15   Emily Filbert, MD  FLUoxetine (PROZAC) 40 MG capsule Take 40 mg by mouth daily.    [provider]  furosemide (LASIX) 20 MG tablet Take 1 tablet (20 mg total) by mouth daily. 01/03/16 01/02/17  Arnaldo Natal, MD  gabapentin (NEURONTIN) 300 MG capsule Take 300 mg by mouth 3 (three) times daily.    [provider]  guaiFENesin (MUCINEX) 600 MG 12 hr tablet Take 1 tablet (600 mg total) by mouth 2 (two) times daily. 03/02/16   Auburn Bilberry, MD  guaiFENesin-codeine 100-10 MG/5ML syrup Take 10 mLs by mouth every 4 (four) hours as needed for cough. 03/02/16   Auburn Bilberry, MD  ibuprofen (ADVIL,MOTRIN) 800 MG tablet Take 1 tablet (800 mg total) by mouth every 8 (eight) hours as needed for moderate pain. 10/26/15   Joni Reining, PA-C  LORazepam (ATIVAN) 1 MG tablet Take 1 tablet (1 mg total) by mouth 2 (two) times daily as needed for anxiety. 11/01/16   Jennye Moccasin,  MD  ondansetron (ZOFRAN ODT) 4 MG disintegrating tablet Take 1 tablet (4 mg total) by mouth every 8 (eight) hours as needed for nausea or vomiting. 06/19/17   Emily FilbertWilliams, Jonathan E, MD  ondansetron (ZOFRAN) 4 MG tablet Take 1 tablet (4 mg total) by mouth every 8 (eight) hours as needed for nausea or vomiting. 05/08/18   Jeanmarie PlantMcShane, James A, MD  pantoprazole (PROTONIX) 40 MG tablet Take 40 mg by mouth daily.    [provider]  potassium chloride (K-DUR) 10 MEQ tablet Take 1 tablet (10 mEq total) by mouth daily. 06/19/17   Emily FilbertWilliams, Jonathan E, MD  predniSONE (STERAPRED UNI-PAK 21 TAB) 10 MG (21) TBPK tablet Take 1  tablet (10 mg total) by mouth daily. 03/02/16   Auburn BilberryPatel, Shreyang, MD  spironolactone (ALDACTONE) 25 MG tablet Take 25 mg by mouth daily.    [provider]  traZODone (DESYREL) 100 MG tablet Take 100 mg by mouth at bedtime.    [provider]    Allergies  Allergen Reactions  . Erythromycin Rash    Family History  Problem Relation Age of Onset  . CAD Mother     Social History Social History   Tobacco Use  . Smoking status: Current Every Day Smoker    Packs/day: 0.50    Types: Cigarettes    Last attempt to quit: 09/17/2016    Years since quitting: 2.3  . Smokeless tobacco: Never Used  Substance Use Topics  . Alcohol use: No  . Drug use: No    Review of Systems Constitutional: Negative for fever ENT: Negative for recent illness/congestion Cardiovascular: Mid chest pain, worse with cough. Respiratory: Positive for shortness of breath, somewhat chronic per patient.  Positive for cough with sputum production, also somewhat chronic per patient. Gastrointestinal: Negative for abdominal pain, vomiting Genitourinary: Negative for urinary compaints Musculoskeletal: Negative for leg pain or swelling. Skin: Negative for skin complaints  Neurological: Negative for headache All other ROS negative  ____________________________________________   PHYSICAL EXAM:  VITAL SIGNS: ED Triage Vitals  Enc Vitals Group     BP 01/23/19 1618 128/68     Pulse Rate 01/23/19 1618 70     Resp 01/23/19 1618 18     Temp 01/23/19 1618 98.4 F (36.9 C)     Temp Source 01/23/19 1618 Oral     SpO2 01/23/19 1618 98 %     Weight 01/23/19 1630 177 lb (80.3 kg)     Height 01/23/19 1630 5\' 7"  (1.702 m)     Head Circumference --      Peak Flow --      Pain Score 01/23/19 1630 7     Pain Loc --      Pain Edu? --      Excl. in GC? --    Constitutional: Alert and oriented. Well appearing and in no distress. Eyes: Normal exam ENT   Head: Normocephalic and atraumatic.    Mouth/Throat: Mucous membranes are moist. Cardiovascular: Normal rate, regular rhythm. No murmur Respiratory: Normal respiratory effort without tachypnea nor retractions.  Patient does have mild expiratory wheeze bilaterally, with frequent cough during exam and with attempted deep inspiration. Gastrointestinal: Soft and nontender. No distention.   Musculoskeletal: Nontender with normal range of motion in all extremities. No lower extremity tenderness or edema. Neurologic:  Normal speech and language. No gross focal neurologic deficits  Skin:  Skin is warm, dry and intact.  Psychiatric: Mood and affect are normal.  ____________________________________________    EKG  EKG viewed and interpreted by myself shows a normal sinus rhythm at 64 bpm with a narrow QRS, normal axis, largely normal intervals with nonspecific but no concerning ST changes.  ____________________________________________    RADIOLOGY  Chest x-ray is negative for acute process.  ____________________________________________   INITIAL IMPRESSION / ASSESSMENT AND PLAN / ED COURSE  Pertinent labs & imaging results that were available during my care of the patient were reviewed by me and considered in my medical decision making (see chart for details).  Patient presents to the emergency department for cough, shortness of breath with mild chest discomfort.  Patient has COPD.  Differential at this time would include COPD exacerbation, pneumonia, URI.  Patient is afebrile.  Patient's labs are largely within normal limits, troponin negative.  Chest x-ray is clear, EKG is reassuring.  Patient does have mild expiratory wheeze on examination with occasional cough during examination.  Suspect COPD exacerbation possibly with bronchitis.  Given the patient's history we will cover with a short course of antibiotics.  We will also cover with prednisone.  We will dose DuoNeb's in the emergency  department.  ____________________________________________   FINAL CLINICAL IMPRESSION(S) / ED DIAGNOSES  COPD exacerbation Bronchitis   Minna Antis, MD 01/23/19 [phone]

## 2019-01-23 NOTE — ED Notes (Signed)
Pt verbalized understanding of d/c instructions, rx and f/u care. No further questions at this time. Pt ambulatory to the exit with steady gait.

## 2019-01-23 NOTE — ED Triage Notes (Signed)
Patient presents to the ED with productive cough, shortness of breath and back pain since yesterday.  Patient states she has felt worse as today has passed.  Patient reports history of asthma and copd and history of pneumonia.  Patient states, "It kind of feels like pneumonia."  Patient is speaking in full sentences at this time.

## 2019-01-23 NOTE — ED Notes (Signed)
Pt denies any fevers at home.

## 2019-11-04 ENCOUNTER — Encounter: Payer: Self-pay | Admitting: Emergency Medicine

## 2019-11-04 ENCOUNTER — Emergency Department
Admission: EM | Admit: 2019-11-04 | Discharge: 2019-11-04 | Disposition: A | Discharge status: Home / Self Care | Payer: Medicaid Other | Attending: Emergency Medicine | Admitting: Emergency Medicine

## 2019-11-04 ENCOUNTER — Emergency Department: Payer: Medicaid Other

## 2019-11-04 ENCOUNTER — Other Ambulatory Visit: Payer: Self-pay

## 2019-11-04 DIAGNOSIS — J069 Acute upper respiratory infection, unspecified: Secondary | ICD-10-CM

## 2019-11-04 DIAGNOSIS — N390 Urinary tract infection, site not specified: Secondary | ICD-10-CM | POA: Diagnosis not present

## 2019-11-04 DIAGNOSIS — J449 Chronic obstructive pulmonary disease, unspecified: Secondary | ICD-10-CM | POA: Insufficient documentation

## 2019-11-04 DIAGNOSIS — J45909 Unspecified asthma, uncomplicated: Secondary | ICD-10-CM | POA: Diagnosis not present

## 2019-11-04 DIAGNOSIS — F17210 Nicotine dependence, cigarettes, uncomplicated: Secondary | ICD-10-CM | POA: Insufficient documentation

## 2019-11-04 DIAGNOSIS — R05 Cough: Secondary | ICD-10-CM | POA: Diagnosis present

## 2019-11-04 DIAGNOSIS — Z20828 Contact with and (suspected) exposure to other viral communicable diseases: Secondary | ICD-10-CM | POA: Insufficient documentation

## 2019-11-04 DIAGNOSIS — Z79899 Other long term (current) drug therapy: Secondary | ICD-10-CM | POA: Diagnosis not present

## 2019-11-04 LAB — SARS CORONAVIRUS 2 (TAT 6-24 HRS): SARS Coronavirus 2: NEGATIVE

## 2019-11-04 MED ORDER — AMOXICILLIN-POT CLAVULANATE 875-125 MG PO TABS: 1.0000 | ORAL_TABLET | Freq: Two times a day (BID) | ORAL | 0 refills | Status: AC

## 2019-11-04 MED ORDER — GUAIFENESIN-CODEINE 100-10 MG/5ML PO SOLN: 5.0000 mL | Freq: Four times a day (QID) | ORAL | 0 refills | Status: DC | PRN

## 2019-11-04 NOTE — ED Provider Notes (Signed)
[facility] Emergency Department Provider Note  Time seen: 12:48 PM  I have reviewed the triage vital signs and the nursing notes.   HISTORY  Chief Complaint Asthma and Shortness of Breath   HPI Denise Valentine is a 48 y.o. female with a past medical history of asthma, COPD, presents to the emergency department for 3 days of cough fever and shortness of breath.  According to the patient for the past 3 days she has had fever as high as 101 has been coughing with initially mucus production and now it has been a dry cough per patient.  Patient states chest discomfort at times due to coughing.   Past Medical History:  Diagnosis Date  . Anxiety   . Asthma   . COPD (chronic obstructive pulmonary disease) ([place])   . Depression   . GERD (gastroesophageal reflux disease)   . Hepatitis C   . Ovarian cyst   . Seizures [facility])     Patient Active Problem List   Diagnosis Date Noted  . Acute on chronic respiratory failure with hypoxia ([place]) 02/27/2016    Past Surgical History:  Procedure Laterality Date  . BACK SURGERY    . CHOLECYSTECTOMY    . TONSILLECTOMY    . TUBAL LIGATION    . WISDOM TOOTH EXTRACTION      Prior to Admission medications   Medication Sig Start Date End Date Taking? Authorizing Provider  ADVAIR DISKUS 250-50 MCG/DOSE AEPB Inhale 1 puff into the lungs 2 (two) times daily. 02/17/16   [provider]  albuterol (PROVENTIL HFA;VENTOLIN HFA) 108 (90 Base) MCG/ACT inhaler Inhale 2 puffs into the lungs every 6 (six) hours as needed for wheezing or shortness of breath. 02/27/16   Joanne Gavel, MD  albuterol (PROVENTIL) (2.5 MG/3ML) 0.083% nebulizer solution Inhale 3 mLs into the lungs every 6 (six) hours as needed. 02/17/16   [provider]  budesonide-formoterol (SYMBICORT) 160-4.5 MCG/ACT inhaler Inhale 2 puffs into the lungs 2 (two) times daily.    [provider]  buprenorphine-naloxone (SUBOXONE) 8-2 MG SUBL SL tablet Place 1  tablet under the tongue 3 (three) times daily.    [provider]  ferrous sulfate (EQL SLOW RELEASE IRON) 160 (50 Fe) MG TBCR SR tablet Take 1 tablet (160 mg total) by mouth daily. 12/27/15   Earleen Newport, MD  FLUoxetine (PROZAC) 40 MG capsule Take 40 mg by mouth daily.    [provider]  furosemide (LASIX) 20 MG tablet Take 1 tablet (20 mg total) by mouth daily. 01/03/16 01/02/17  Nena Polio, MD  gabapentin (NEURONTIN) 300 MG capsule Take 300 mg by mouth 3 (three) times daily.    [provider]  guaiFENesin (MUCINEX) 600 MG 12 hr tablet Take 1 tablet (600 mg total) by mouth 2 (two) times daily. 03/02/16   Dustin Flock, MD  guaiFENesin-codeine 100-10 MG/5ML syrup Take 10 mLs by mouth every 4 (four) hours as needed for cough. 03/02/16   Dustin Flock, MD  ibuprofen (ADVIL,MOTRIN) 800 MG tablet Take 1 tablet (800 mg total) by mouth every 8 (eight) hours as needed for moderate pain. 10/26/15   Sable Feil, PA-C  LORazepam (ATIVAN) 1 MG tablet Take 1 tablet (1 mg total) by mouth 2 (two) times daily as needed for anxiety. 11/01/16   Daymon Larsen, MD  ondansetron (ZOFRAN ODT) 4 MG disintegrating tablet Take 1 tablet (4 mg total) by mouth every 8 (eight) hours as needed for nausea or  vomiting. 06/19/17   Emily FilbertWilliams, Jonathan E, MD  ondansetron (ZOFRAN) 4 MG tablet Take 1 tablet (4 mg total) by mouth every 8 (eight) hours as needed for nausea or vomiting. 05/08/18   Jeanmarie PlantMcShane, James A, MD  pantoprazole (PROTONIX) 40 MG tablet Take 40 mg by mouth daily.    [provider]  potassium chloride (K-DUR) 10 MEQ tablet Take 1 tablet (10 mEq total) by mouth daily. 06/19/17   Emily FilbertWilliams, Jonathan E, MD  predniSONE (DELTASONE) 20 MG tablet Take 2 tablets (40 mg total) by mouth daily. 01/23/19   Minna AntisPaduchowski, Laina Guerrieri, MD  spironolactone (ALDACTONE) 25 MG tablet Take 25 mg by mouth daily.    [provider]  traZODone (DESYREL) 100 MG tablet Take 100 mg by mouth at  bedtime.    [provider]    Allergies  Allergen Reactions  . Erythromycin Rash    Family History  Problem Relation Age of Onset  . CAD Mother     Social History Social History   Tobacco Use  . Smoking status: Current Every Day Smoker    Packs/day: 0.50    Types: Cigarettes    Last attempt to quit: 09/17/2016    Years since quitting: 3.1  . Smokeless tobacco: Never Used  Substance Use Topics  . Alcohol use: No  . Drug use: No    Review of Systems Constitutional: Fever to 101 Cardiovascular: Chest pain with coughing only Respiratory: Mild shortness of breath.  Cough. Gastrointestinal: Negative for abdominal pain Musculoskeletal: Negative for musculoskeletal complaints Neurological: Negative for headache All other ROS negative  ____________________________________________   PHYSICAL EXAM:  VITAL SIGNS: ED Triage Vitals  Enc Vitals Group     BP 11/04/19 1118 (!) 106/58     Pulse Rate 11/04/19 1118 71     Resp 11/04/19 1118 19     Temp 11/04/19 1118 98.3 F (36.8 C)     Temp Source 11/04/19 1118 Oral     SpO2 11/04/19 1118 98 %     Weight 11/04/19 1117 175 lb (79.4 kg)     Height 11/04/19 1117 5\' 7"  (1.702 m)     Head Circumference --      Peak Flow --      Pain Score 11/04/19 1121 0     Pain Loc --      Pain Edu? --      Excl. in GC? --     Constitutional: Alert and oriented. Well appearing and in no distress. Eyes: Normal exam ENT      Head: Normocephalic and atraumatic.      Mouth/Throat: Mucous membranes are moist. Cardiovascular: Normal rate, regular rhythm.  Respiratory: Normal respiratory effort without tachypnea nor retractions. Breath sounds are clear, with no obvious wheeze rales or rhonchi. Gastrointestinal: Soft and nontender. No distention.   Musculoskeletal: Nontender with normal range of motion in all extremities. Neurologic:  Normal speech and language. No gross focal neurologic deficits  Skin:  Skin is warm, dry and  intact.  Psychiatric: Mood and affect are normal  ____________________________________________    EKG  EKG viewed and interpreted by myself shows a normal sinus rhythm at 60 bpm with a narrow QRS, normal axis, normal intervals, no concerning ST changes.  ____________________________________________    RADIOLOGY  Chest x-ray is negative  ____________________________________________   INITIAL IMPRESSION / ASSESSMENT AND PLAN / ED COURSE  Pertinent labs & imaging results that were available during my care of the patient were reviewed by me and considered in  my medical decision making (see chart for details).   Patient presents to the emergency department for cough fever x3 days.  Differential would include bronchitis, pneumonia, Covid or other upper respiratory infection.  Chest x-ray is negative EKG is reassuring.  We will swab for Covid.  We will discharge with a course of Zithromax to cover for acute bronchitis, cough medication and I discussed isolation precautions until she knows her Covid status.  Patient agreeable to plan of care.  Denise Valentine was evaluated in Emergency Department on 11/04/2019 for the symptoms described in the history of present illness. She was evaluated in the context of the global COVID-19 pandemic, which necessitated consideration that the patient might be at risk for infection with the SARS-CoV-2 virus that causes COVID-19. Institutional protocols and algorithms that pertain to the evaluation of patients at risk for COVID-19 are in a state of rapid change based on information released by regulatory bodies including the CDC and federal and state organizations. These policies and algorithms were followed during the patient's care in the ED.  ____________________________________________   FINAL CLINICAL IMPRESSION(S) / ED DIAGNOSES  Upper respiratory infection   Minna Antis, MD 11/04/19 1250

## 2019-11-04 NOTE — ED Triage Notes (Signed)
PT c/o SOB and cough, states can't get mucous up. Pt RR even and unlabored. NAD noted . Denies fever

## 2019-11-04 NOTE — ED Notes (Signed)
NO questions at this time. RN explained discharge papers and pt ambulatory at time of discharge NAD noted.

## 2019-11-04 NOTE — ED Notes (Signed)
Pt reports cough x 3 days with productive green sputum at first but currently nonproductive cough. Fever of 101 at home, afebrile in ED. PT talking in complete sentences. No increased WOB noted when ambulating to treatment room.

## 2019-11-04 NOTE — ED Notes (Signed)
MD at bedside. 

## 2020-12-08 ENCOUNTER — Emergency Department: Payer: Medicaid Other

## 2020-12-08 ENCOUNTER — Other Ambulatory Visit: Payer: Self-pay

## 2020-12-08 ENCOUNTER — Encounter: Payer: Self-pay | Admitting: Emergency Medicine

## 2020-12-08 ENCOUNTER — Emergency Department
Admission: EM | Admit: 2020-12-08 | Discharge: 2020-12-08 | Disposition: A | Discharge status: Home / Self Care | Payer: Medicaid Other | Attending: Emergency Medicine | Admitting: Emergency Medicine

## 2020-12-08 DIAGNOSIS — J449 Chronic obstructive pulmonary disease, unspecified: Secondary | ICD-10-CM | POA: Diagnosis not present

## 2020-12-08 DIAGNOSIS — S9782XA Crushing injury of left foot, initial encounter: Secondary | ICD-10-CM | POA: Diagnosis not present

## 2020-12-08 DIAGNOSIS — Y9301 Activity, walking, marching and hiking: Secondary | ICD-10-CM | POA: Diagnosis not present

## 2020-12-08 DIAGNOSIS — Z7951 Long term (current) use of inhaled steroids: Secondary | ICD-10-CM | POA: Insufficient documentation

## 2020-12-08 DIAGNOSIS — F17210 Nicotine dependence, cigarettes, uncomplicated: Secondary | ICD-10-CM | POA: Insufficient documentation

## 2020-12-08 DIAGNOSIS — Y92410 Unspecified street and highway as the place of occurrence of the external cause: Secondary | ICD-10-CM | POA: Diagnosis not present

## 2020-12-08 DIAGNOSIS — S99922A Unspecified injury of left foot, initial encounter: Secondary | ICD-10-CM | POA: Diagnosis present

## 2020-12-08 DIAGNOSIS — Z79899 Other long term (current) drug therapy: Secondary | ICD-10-CM | POA: Insufficient documentation

## 2020-12-08 MED ORDER — OXYCODONE-ACETAMINOPHEN 5-325 MG PO TABS
1.0000 | ORAL_TABLET | Freq: Once | ORAL | Status: AC
  Administered 2020-12-08: 1 via ORAL
  Filled 2020-12-08: qty 1

## 2020-12-08 MED ORDER — HYDROCODONE-ACETAMINOPHEN 5-325 MG PO TABS: 1.0000 | ORAL_TABLET | Freq: Four times a day (QID) | ORAL | 0 refills | Status: DC | PRN
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
## 2020-12-08 NOTE — Discharge Instructions (Addendum)
Follow-up with your primary care provider if any continued problems or concerns.  Ice and elevation to reduce swelling and help with the pain in your foot.  Wear Ace wrap and postop shoe for added protection and support.  Take pain medication only as directed and if any continued pain medication is needed you will need to see your primary care provider or urgent care  You can also take ibuprofen 3 tablets with food three times a day for inflammation.

## 2021-01-04 ENCOUNTER — Emergency Department: Payer: Medicaid Other

## 2021-01-04 ENCOUNTER — Other Ambulatory Visit: Payer: Self-pay

## 2021-01-04 ENCOUNTER — Encounter: Payer: Self-pay | Admitting: Emergency Medicine

## 2021-01-04 ENCOUNTER — Emergency Department
Admission: EM | Admit: 2021-01-04 | Discharge: 2021-01-04 | Disposition: A | Discharge status: Home / Self Care | Payer: Medicaid Other | Attending: Emergency Medicine | Admitting: Emergency Medicine

## 2021-01-04 DIAGNOSIS — M79604 Pain in right leg: Secondary | ICD-10-CM | POA: Insufficient documentation

## 2021-01-04 DIAGNOSIS — Z5321 Procedure and treatment not carried out due to patient leaving prior to being seen by health care provider: Secondary | ICD-10-CM | POA: Diagnosis not present

## 2021-01-04 HISTORY — DX: Acute embolism and thrombosis of unspecified deep veins of unspecified lower extremity: I82.409

## 2021-01-04 NOTE — ED Notes (Signed)
States she is wanting to leave  Encouraged to wait

## 2021-01-04 NOTE — ED Triage Notes (Signed)
C/O right upper leg pain x 2 days. Describes pain as deep and painful.  States has history of blood clots in the past, not taking blood thinner.  AAOx3.  Skin warm and dry. No SOB/ DOE
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
## 2021-02-10 ENCOUNTER — Other Ambulatory Visit: Payer: Self-pay

## 2021-02-10 ENCOUNTER — Emergency Department
Admission: EM | Admit: 2021-02-10 | Discharge: 2021-02-10 | Disposition: A | Discharge status: Home / Self Care | Payer: Medicaid Other | Attending: Emergency Medicine | Admitting: Emergency Medicine

## 2021-02-10 ENCOUNTER — Emergency Department: Payer: Medicaid Other

## 2021-02-10 DIAGNOSIS — A084 Viral intestinal infection, unspecified: Secondary | ICD-10-CM | POA: Diagnosis not present

## 2021-02-10 DIAGNOSIS — Z79899 Other long term (current) drug therapy: Secondary | ICD-10-CM | POA: Diagnosis not present

## 2021-02-10 DIAGNOSIS — Z20822 Contact with and (suspected) exposure to covid-19: Secondary | ICD-10-CM | POA: Insufficient documentation

## 2021-02-10 DIAGNOSIS — J449 Chronic obstructive pulmonary disease, unspecified: Secondary | ICD-10-CM | POA: Diagnosis not present

## 2021-02-10 DIAGNOSIS — F17210 Nicotine dependence, cigarettes, uncomplicated: Secondary | ICD-10-CM | POA: Diagnosis not present

## 2021-02-10 DIAGNOSIS — R112 Nausea with vomiting, unspecified: Secondary | ICD-10-CM | POA: Diagnosis present

## 2021-02-10 LAB — RESP PANEL BY RT-PCR (FLU A&B, COVID) ARPGX2
Influenza A by PCR: NEGATIVE
Influenza B by PCR: NEGATIVE
SARS Coronavirus 2 by RT PCR: NEGATIVE

## 2021-02-10 LAB — COMPREHENSIVE METABOLIC PANEL
ALT: 18 U/L (ref 0–44)
AST: 19 U/L (ref 15–41)
Albumin: 3.7 g/dL (ref 3.5–5.0)
Alkaline Phosphatase: 77 U/L (ref 38–126)
Anion gap: 8 (ref 5–15)
BUN: 8 mg/dL (ref 6–20)
CO2: 23 mmol/L (ref 22–32)
Calcium: 8.7 mg/dL — ABNORMAL LOW (ref 8.9–10.3)
Chloride: 107 mmol/L (ref 98–111)
Creatinine, Ser: 0.7 mg/dL (ref 0.44–1.00)
GFR, Estimated: >60 mL/min (ref >60)
Glucose, Bld: 108 mg/dL — ABNORMAL HIGH (ref 70–99)
Potassium: 3.8 mmol/L (ref 3.5–5.1)
Sodium: 138 mmol/L (ref 135–145)
Total Bilirubin: 0.8 mg/dL (ref 0.3–1.2)
Total Protein: 6.6 g/dL (ref 6.5–8.1)

## 2021-02-10 LAB — CBC WITH DIFFERENTIAL/PLATELET
Abs Immature Granulocytes: 0.01 10*3/uL (ref 0.00–0.07)
Basophils Absolute: 0 10*3/uL (ref 0.0–0.1)
Basophils Relative: 0 %
Eosinophils Absolute: 0 10*3/uL (ref 0.0–0.5)
Eosinophils Relative: 0 %
HCT: 42.7 % (ref 36.0–46.0)
Hemoglobin: 14 g/dL (ref 12.0–15.0)
Immature Granulocytes: 0 %
Lymphocytes Relative: 27 %
Lymphs Abs: 2.1 10*3/uL (ref 0.7–4.0)
MCH: 27.4 pg (ref 26.0–34.0)
MCHC: 32.8 g/dL (ref 30.0–36.0)
MCV: 83.6 fL (ref 80.0–100.0)
Monocytes Absolute: 0.5 10*3/uL (ref 0.1–1.0)
Monocytes Relative: 7 %
Neutro Abs: 4.9 10*3/uL (ref 1.7–7.7)
Neutrophils Relative %: 66 %
Platelets: 234 10*3/uL (ref 150–400)
RBC: 5.11 MIL/uL (ref 3.87–5.11)
RDW: 14.6 % (ref 11.5–15.5)
WBC: 7.5 10*3/uL (ref 4.0–10.5)
nRBC: 0 % (ref 0.0–0.2)

## 2021-02-10 LAB — LIPASE, BLOOD: Lipase: 30 U/L (ref 11–51)

## 2021-02-10 LAB — POC SARS CORONAVIRUS 2 AG -  ED: SARS Coronavirus 2 Ag: NEGATIVE

## 2021-02-10 MED ORDER — ONDANSETRON HCL 4 MG/2ML IJ SOLN
4.0000 mg | Freq: Once | INTRAMUSCULAR | Status: AC
  Administered 2021-02-10: 4 mg via INTRAVENOUS
  Filled 2021-02-10: qty 2

## 2021-02-10 MED ORDER — FAMOTIDINE 20 MG PO TABS: 20.0000 mg | ORAL_TABLET | Freq: Two times a day (BID) | ORAL | 0 refills | Status: DC

## 2021-02-10 MED ORDER — PANTOPRAZOLE SODIUM 40 MG IV SOLR
40.0000 mg | Freq: Once | INTRAVENOUS | Status: AC
  Administered 2021-02-10: 40 mg via INTRAVENOUS
  Filled 2021-02-10: qty 40

## 2021-02-10 MED ORDER — ONDANSETRON 4 MG PO TBDP: 4.0000 mg | ORAL_TABLET | Freq: Three times a day (TID) | ORAL | 0 refills | Status: DC | PRN

## 2021-02-10 MED ORDER — LACTATED RINGERS IV BOLUS
1000.0000 mL | Freq: Once | INTRAVENOUS | Status: AC
  Administered 2021-02-10: 1000 mL via INTRAVENOUS

## 2021-02-10 MED ORDER — DEXTROSE-NACL 5-0.9 % IV SOLN
1000.0000 mL | Freq: Once | INTRAVENOUS | Status: AC
  Administered 2021-02-10: 1000 mL via INTRAVENOUS

## 2021-02-10 MED ORDER — FAMOTIDINE 20 MG PO TABS
40.0000 mg | ORAL_TABLET | Freq: Once | ORAL | Status: AC
  Administered 2021-02-10: 40 mg via ORAL
  Filled 2021-02-10: qty 2

## 2021-02-10 MED ORDER — HALOPERIDOL LACTATE 5 MG/ML IJ SOLN
2.0000 mg | Freq: Once | INTRAMUSCULAR | Status: AC
  Administered 2021-02-10: 2 mg via INTRAVENOUS
  Filled 2021-02-10: qty 1

## 2021-02-10 MED ORDER — KETOROLAC TROMETHAMINE 30 MG/ML IJ SOLN
15.0000 mg | INTRAMUSCULAR | Status: AC
  Administered 2021-02-10: 15 mg via INTRAVENOUS
  Filled 2021-02-10: qty 1

## 2021-02-10 MED ORDER — NAPROXEN 500 MG PO TABS: 500.0000 mg | ORAL_TABLET | Freq: Two times a day (BID) | ORAL | 0 refills | Status: DC

## 2021-02-10 MED ORDER — ALUM & MAG HYDROXIDE-SIMETH 200-200-20 MG/5ML PO SUSP
30.0000 mL | Freq: Once | ORAL | Status: AC
  Administered 2021-02-10: 30 mL via ORAL
  Filled 2021-02-10: qty 30

## 2021-02-10 NOTE — ED Notes (Signed)
Pt given PO challenge.  RN to re-assess in 30 minutes.

## 2021-02-10 NOTE — ED Provider Notes (Signed)
[facility] Emergency Department Provider Note  ____________________________________________  Time seen: Approximately 10:37 AM  I have reviewed the triage vital signs and the nursing notes.   HISTORY  Chief Complaint URI, Emesis, and Diarrhea    HPI Denise Valentine is a 50 y.o. female with a history of COPD DVT GERD ovarian cyst who comes ED complaining of generalized body aches, chills, shortness of breath nausea vomiting and diarrhea for the past 3 days.  Symptoms are constant, waxing waning, no aggravating or alleviating factors.  Has not been able to eat or drink in the last 2 or 3 days due to the symptoms.  No chest pain.  No syncope.  She does feel lightheaded when she stands up.      Past Medical History:  Diagnosis Date  . Anxiety   . Asthma   . COPD (chronic obstructive pulmonary disease) (HCC)   . Depression   . DVT (deep venous thrombosis) (HCC)   . GERD (gastroesophageal reflux disease)   . Hepatitis C   . Ovarian cyst   . Seizures [facility])      Patient Active Problem List   Diagnosis Date Noted  . Acute on chronic respiratory failure with hypoxia (HCC) 02/27/2016     Past Surgical History:  Procedure Laterality Date  . BACK SURGERY    . CHOLECYSTECTOMY    . TONSILLECTOMY    . TUBAL LIGATION    . WISDOM TOOTH EXTRACTION       Prior to Admission medications   Medication Sig Start Date End Date Taking? Authorizing Provider  famotidine (PEPCID) 20 MG tablet Take 1 tablet (20 mg total) by mouth 2 (two) times daily. 02/10/21  Yes Sharman Cheek, MD  naproxen (NAPROSYN) 500 MG tablet Take 1 tablet (500 mg total) by mouth 2 (two) times daily with a meal. 02/10/21  Yes Sharman Cheek, MD  ondansetron (ZOFRAN ODT) 4 MG disintegrating tablet Take 1 tablet (4 mg total) by mouth every 8 (eight) hours as needed for nausea or vomiting. 02/10/21  Yes Sharman Cheek, MD  ADVAIR DISKUS 250-50 MCG/DOSE AEPB Inhale 1 puff into the lungs 2 (two)  times daily. 02/17/16   [provider]  albuterol (PROVENTIL) (2.5 MG/3ML) 0.083% nebulizer solution Inhale 3 mLs into the lungs every 6 (six) hours as needed. 02/17/16   [provider]  budesonide-formoterol (SYMBICORT) 160-4.5 MCG/ACT inhaler Inhale 2 puffs into the lungs 2 (two) times daily.    [provider]  buprenorphine-naloxone (SUBOXONE) 8-2 MG SUBL SL tablet Place 1 tablet under the tongue 3 (three) times daily.    [provider]  ferrous sulfate (EQL SLOW RELEASE IRON) 160 (50 Fe) MG TBCR SR tablet Take 1 tablet (160 mg total) by mouth daily. 12/27/15   Emily Filbert, MD  FLUoxetine (PROZAC) 40 MG capsule Take 40 mg by mouth daily.    [provider]  furosemide (LASIX) 20 MG tablet Take 1 tablet (20 mg total) by mouth daily. 01/03/16 01/02/17  Arnaldo Natal, MD  gabapentin (NEURONTIN) 300 MG capsule Take 300 mg by mouth 3 (three) times daily.    [provider]  HYDROcodone-acetaminophen (NORCO/VICODIN) 5-325 MG tablet Take 1 tablet by mouth every 6 (six) hours as needed for moderate pain. 12/08/20   Tommi Rumps, PA-C  LORazepam (ATIVAN) 1 MG tablet Take 1 tablet (1 mg total) by mouth 2 (two) times daily as needed for anxiety. 11/01/16   Jennye Moccasin, MD  pantoprazole (PROTONIX) 40  MG tablet Take 40 mg by mouth daily.    [provider]  spironolactone (ALDACTONE) 25 MG tablet Take 25 mg by mouth daily.    [provider]  traZODone (DESYREL) 100 MG tablet Take 100 mg by mouth at bedtime.    [provider]  potassium chloride (K-DUR) 10 MEQ tablet Take 1 tablet (10 mEq total) by mouth daily. 06/19/17 12/08/20  Emily Filbert, MD     Allergies Tramadol and Erythromycin   Family History  Problem Relation Age of Onset  . CAD Mother     Social History Social History   Tobacco Use  . Smoking status: Current Every Day Smoker    Packs/day: 0.50    Types: Cigarettes    Last  attempt to quit: 09/17/2016    Years since quitting: 4.4  . Smokeless tobacco: Never Used  Substance Use Topics  . Alcohol use: No  . Drug use: No    Review of Systems  Constitutional:   No fever positive chills.  ENT:   No sore throat. No rhinorrhea. Cardiovascular:   No chest pain or syncope. Respiratory:   Positive shortness of breath and nonproductive cough. Gastrointestinal:   Positive as above for abdominal pain, vomiting and diarrhea.  Musculoskeletal:   Negative for focal pain or swelling All other systems reviewed and are negative except as documented above in ROS and HPI.  ____________________________________________   PHYSICAL EXAM:  VITAL SIGNS: ED Triage Vitals  Enc Vitals Group     BP 02/10/21 0842 131/78     Pulse Rate 02/10/21 0842 (!) 104     Resp 02/10/21 0842 20     Temp 02/10/21 0842 98.1 F (36.7 C)     Temp Source 02/10/21 0842 Oral     SpO2 02/10/21 0842 96 %     Weight 02/10/21 0843 161 lb (73 kg)     Height 02/10/21 0843 5\' 7"  (1.702 m)     Head Circumference --      Peak Flow --      Pain Score 02/10/21 0843 9     Pain Loc --      Pain Edu? --      Excl. in GC? --     Vital signs reviewed, nursing assessments reviewed.   Constitutional:   Alert and oriented. Non-toxic appearance. Eyes:   Conjunctivae are normal. EOMI. PERRL. ENT      Head:   Normocephalic and atraumatic.      Nose:   Wearing a mask.      Mouth/Throat:   Wearing a mask.      Neck:   No meningismus. Full ROM. Hematological/Lymphatic/Immunilogical:   No cervical lymphadenopathy. Cardiovascular:   Tachycardia heart rate 105. Symmetric bilateral radial and DP pulses.  No murmurs. Cap refill less than 2 seconds. Respiratory:   Normal respiratory effort without tachypnea/retractions. Breath sounds are clear and equal bilaterally. No wheezes/rales/rhonchi.  No inducible wheezing or cough with FEV1 maneuver Gastrointestinal:   Soft and nontender. Non distended. There is no CVA  tenderness.  No rebound, rigidity, or guarding.  Musculoskeletal:   Normal range of motion in all extremities. No joint effusions.  No lower extremity tenderness.  No edema. Neurologic:   Normal speech and language.  Motor grossly intact. No acute focal neurologic deficits are appreciated.  Skin:    Skin is warm, dry and intact. No rash noted.  No petechiae, purpura, or bullae.  ____________________________________________    LABS (pertinent positives/negatives) (all labs  ordered are listed, but only abnormal results are displayed) Labs Reviewed  COMPREHENSIVE METABOLIC PANEL - Abnormal; Notable for the following components:      Result Value   Glucose, Bld 108 (*)    Calcium 8.7 (*)    All other components within normal limits  RESP PANEL BY RT-PCR (FLU A&B, COVID) ARPGX2  LIPASE, BLOOD  CBC WITH DIFFERENTIAL/PLATELET  POC SARS CORONAVIRUS 2 AG -  ED   ____________________________________________   EKG    ____________________________________________    RADIOLOGY  DG Chest Portable 1 View  Result Date: 02/10/2021 CLINICAL DATA:  Cough and shortness of breath EXAM: PORTABLE CHEST 1 VIEW COMPARISON:  November 04, 2019 FINDINGS: There are areas of mild interstitial thickening, stable. There is no evident edema or airspace opacity. The heart size and pulmonary vascularity are normal. No adenopathy. No bone lesions. IMPRESSION: Areas of mild interstitial thickening which may be indicative of a degree of chronic underlying bronchitis. No edema or airspace opacity. Heart size normal. Electronically Signed   By: Bretta BangWilliam  Woodruff III M.D.   On: 02/10/2021 09:45    ____________________________________________   PROCEDURES Procedures  ____________________________________________  DIFFERENTIAL DIAGNOSIS   Covid/viral syndrome, dehydration, electrolyte abnormality, pneumonia  CLINICAL IMPRESSION / ASSESSMENT AND PLAN / ED COURSE  Medications ordered in the ED: Medications   dextrose 5 %-0.9 % sodium chloride infusion (0 mLs Intravenous Stopped 02/10/21 1055)  ondansetron (ZOFRAN) injection 4 mg (4 mg Intravenous Given 02/10/21 0919)  ketorolac (TORADOL) 30 MG/ML injection 15 mg (15 mg Intravenous Given 02/10/21 0919)  lactated ringers bolus 1,000 mL (1,000 mLs Intravenous New Bag/Given 02/10/21 1132)  haloperidol lactate (HALDOL) injection 2 mg (2 mg Intravenous Given 02/10/21 1133)  pantoprazole (PROTONIX) injection 40 mg (40 mg Intravenous Given 02/10/21 1133)  alum & mag hydroxide-simeth (MAALOX/MYLANTA) 200-200-20 MG/5ML suspension 30 mL (30 mLs Oral Given 02/10/21 1133)  famotidine (PEPCID) tablet 40 mg (40 mg Oral Given 02/10/21 1133)    Pertinent labs & imaging results that were available during my care of the patient were reviewed by me and considered in my medical decision making (see chart for details).  Arvid Rightngela Vidrio was evaluated in Emergency Department on 02/10/2021 for the symptoms described in the history of present illness. She was evaluated in the context of the global COVID-19 pandemic, which necessitated consideration that the patient might be at risk for infection with the SARS-CoV-2 virus that causes COVID-19. Institutional protocols and algorithms that pertain to the evaluation of patients at risk for COVID-19 are in a state of rapid change based on information released by regulatory bodies including the CDC and federal and state organizations. These policies and algorithms were followed during the patient's care in the ED.     Clinical Course as of 02/10/21 1402  Thu Feb 10, 2021  [id number] Patient presents with nausea vomiting diarrhea generalized abdominal pain, most likely viral syndrome.  Chest x-ray viewed and interpreted by me, shows some mild lower lung field haziness suggestive of viral illness.  She has underlying COPD but symptoms have been controlled by home use of her bronchodilators, no wheezing or prolonged expiratory phase here.  Oxygenation  normal.  Will give IV fluids, IV Toradol, IV Zofran for symptomatic care. [PS]  1119 Patient still nauseated, abdominal pain feels like it is increasing.  Feels unable to attempt oral intake currently.  Will give IV fluids and additional antiemetics. [PS]    Clinical Course User Index [PS] Sharman CheekStafford, Rithika Seel, MD    ----------------------------------------- 2:02 PM  on 02/10/2021 -----------------------------------------  Feeling better and tolerating p.o.  Vital signs normal.  Stable for discharge   ____________________________________________   FINAL CLINICAL IMPRESSION(S) / ED DIAGNOSES    Final diagnoses:  Viral gastroenteritis     ED Discharge Orders         Ordered    ondansetron (ZOFRAN ODT) 4 MG disintegrating tablet  Every 8 hours PRN        02/10/21 1401    famotidine (PEPCID) 20 MG tablet  2 times daily        02/10/21 1401    naproxen (NAPROSYN) 500 MG tablet  2 times daily with meals        02/10/21 1401          Portions of this note were generated with dragon dictation software. Dictation errors may occur despite best attempts at proofreading.   Sharman Cheek, MD 02/10/21 [id number]

## 2021-02-10 NOTE — ED Notes (Signed)
Pt unable to sign for d/c due to broken topaz signature pad. Pt verbalizes d/c instructions with no further questions at this time. 

## 2021-02-10 NOTE — ED Triage Notes (Signed)
First Nurse Note:  Arrives with C/O fever, N/V/D since Monday.

## 2021-02-10 NOTE — ED Triage Notes (Signed)
Pt c/o cough with SOB, body aches with N/V/D since Monday, states she had family members recently dx with covid but states she has not been around them

## 2021-02-10 NOTE — ED Notes (Signed)
Pt able to tolerate graham crackers and ginger ale with no episodes of emesis. Md Mott notified.

## 2021-03-20 ENCOUNTER — Other Ambulatory Visit: Payer: Self-pay

## 2021-03-20 ENCOUNTER — Emergency Department
Admission: EM | Admit: 2021-03-20 | Discharge: 2021-03-20 | Disposition: A | Discharge status: Home / Self Care | Payer: Medicaid Other | Attending: Emergency Medicine | Admitting: Emergency Medicine

## 2021-03-20 DIAGNOSIS — F17210 Nicotine dependence, cigarettes, uncomplicated: Secondary | ICD-10-CM | POA: Insufficient documentation

## 2021-03-20 DIAGNOSIS — J449 Chronic obstructive pulmonary disease, unspecified: Secondary | ICD-10-CM | POA: Insufficient documentation

## 2021-03-20 DIAGNOSIS — T402X1A Poisoning by other opioids, accidental (unintentional), initial encounter: Secondary | ICD-10-CM | POA: Insufficient documentation

## 2021-03-20 DIAGNOSIS — T40601A Poisoning by unspecified narcotics, accidental (unintentional), initial encounter: Secondary | ICD-10-CM

## 2021-03-20 DIAGNOSIS — J45909 Unspecified asthma, uncomplicated: Secondary | ICD-10-CM | POA: Diagnosis not present

## 2021-03-20 DIAGNOSIS — R519 Headache, unspecified: Secondary | ICD-10-CM | POA: Diagnosis not present

## 2021-03-20 LAB — COMPREHENSIVE METABOLIC PANEL
ALT: 10 U/L (ref 0–44)
AST: 20 U/L (ref 15–41)
Albumin: 3.3 g/dL — ABNORMAL LOW (ref 3.5–5.0)
Alkaline Phosphatase: 81 U/L (ref 38–126)
Anion gap: 10 (ref 5–15)
BUN: 9 mg/dL (ref 6–20)
CO2: 29 mmol/L (ref 22–32)
Calcium: 8.5 mg/dL — ABNORMAL LOW (ref 8.9–10.3)
Chloride: 96 mmol/L — ABNORMAL LOW (ref 98–111)
Creatinine, Ser: 0.83 mg/dL (ref 0.44–1.00)
GFR, Estimated: >60 mL/min (ref >60)
Glucose, Bld: 214 mg/dL — ABNORMAL HIGH (ref 70–99)
Potassium: 3.1 mmol/L — ABNORMAL LOW (ref 3.5–5.1)
Sodium: 135 mmol/L (ref 135–145)
Total Bilirubin: 0.4 mg/dL (ref 0.3–1.2)
Total Protein: 6.7 g/dL (ref 6.5–8.1)

## 2021-03-20 LAB — CBC WITH DIFFERENTIAL/PLATELET
Abs Immature Granulocytes: 0.03 10*3/uL (ref 0.00–0.07)
Basophils Absolute: 0 10*3/uL (ref 0.0–0.1)
Basophils Relative: 1 %
Eosinophils Absolute: 0.1 10*3/uL (ref 0.0–0.5)
Eosinophils Relative: 2 %
HCT: 42.6 % (ref 36.0–46.0)
Hemoglobin: 14 g/dL (ref 12.0–15.0)
Immature Granulocytes: 1 %
Lymphocytes Relative: 46 %
Lymphs Abs: 2.8 10*3/uL (ref 0.7–4.0)
MCH: 28.2 pg (ref 26.0–34.0)
MCHC: 32.9 g/dL (ref 30.0–36.0)
MCV: 85.7 fL (ref 80.0–100.0)
Monocytes Absolute: 0.7 10*3/uL (ref 0.1–1.0)
Monocytes Relative: 12 %
Neutro Abs: 2.2 10*3/uL (ref 1.7–7.7)
Neutrophils Relative %: 38 %
Platelets: 315 10*3/uL (ref 150–400)
RBC: 4.97 MIL/uL (ref 3.87–5.11)
RDW: 13.6 % (ref 11.5–15.5)
WBC: 5.9 10*3/uL (ref 4.0–10.5)
nRBC: 0 % (ref 0.0–0.2)

## 2021-03-20 MED ORDER — LACTATED RINGERS IV BOLUS
1000.0000 mL | Freq: Once | INTRAVENOUS | Status: AC
  Administered 2021-03-20: 1000 mL via INTRAVENOUS

## 2021-03-20 MED ORDER — BUTALBITAL-APAP-CAFFEINE 50-325-40 MG PO TABS
2.0000 | ORAL_TABLET | Freq: Once | ORAL | Status: AC
  Administered 2021-03-20: 2 via ORAL
  Filled 2021-03-20: qty 2

## 2021-03-20 NOTE — Discharge Instructions (Signed)
Please take Tylenol and ibuprofen/Advil for your pain.  It is safe to take them together, or to alternate them every few hours.  Take up to 1000mg of Tylenol at a time, up to 4 times per day.  Do not take more than 4000 mg of Tylenol in 24 hours.  For ibuprofen, take 400-600 mg, 4-5 times per day. ° ° °

## 2021-03-20 NOTE — ED Notes (Signed)
Pt admits to taking one xanax and one suboxone but denies other drugs

## 2021-03-20 NOTE — ED Triage Notes (Signed)
Pt comes ems from home with possible overdose. Pt was unresponsive with pinpoint pupils and agonal breathing. Was given 2mg  narcan and woke up after about 5 mins. Pt denies using anything. HX of heroin abuse in the past but states has not used. 20G right AC. Pt alert at this time.

## 2021-03-20 NOTE — ED Provider Notes (Signed)
[facility] Emergency Department Provider Note ____________________________________________   Event Date/Time   First MD Initiated Contact with Patient 03/20/21 [phone]     (approximate)  I have reviewed the triage vital signs and the nursing notes.  HISTORY  Chief Complaint Drug Overdose   HPI Denise Valentine is a 50 y.o. femalewho presents to the ED for evaluation of possible drug overdose.  Chart review indicates history of COPD, depression and anxiety, polysubstance abuse.  Patient brought to the ED via EMS from home after a supposed drug overdose.  EMS reports finding the patient somnolent with slow and sonorous respirations, pinpoint pupils, and was provided Narcan with resumption of baseline mental status after this.  Patient reports that she is prescribed Suboxone and gets the Xanax online, and she reports taking half of a Xanax and her typical Suboxone last night before having sexual intercourse with her friend.  She was going to sleep after this, and this is the last thing that she remembers until being awoken by EMS.  She denies coingestions or heroin use, and denies additional recreational drug use.  She is reporting an aching bitemporal headache for which she has not taken any medications.   Past Medical History:  Diagnosis Date  . Anxiety   . Asthma   . COPD (chronic obstructive pulmonary disease) (HCC)   . Depression   . DVT (deep venous thrombosis) (HCC)   . GERD (gastroesophageal reflux disease)   . Hepatitis C   . Ovarian cyst   . Seizures [facility])     Patient Active Problem List   Diagnosis Date Noted  . Acute on chronic respiratory failure with hypoxia (HCC) 02/27/2016    Past Surgical History:  Procedure Laterality Date  . BACK SURGERY    . CHOLECYSTECTOMY    . TONSILLECTOMY    . TUBAL LIGATION    . WISDOM TOOTH EXTRACTION      Prior to Admission medications   Medication Sig Start Date End Date Taking? Authorizing Provider   ADVAIR DISKUS 250-50 MCG/DOSE AEPB Inhale 1 puff into the lungs 2 (two) times daily. 02/17/16   [provider]  albuterol (PROVENTIL) (2.5 MG/3ML) 0.083% nebulizer solution Inhale 3 mLs into the lungs every 6 (six) hours as needed. 02/17/16   [provider]  budesonide-formoterol (SYMBICORT) 160-4.5 MCG/ACT inhaler Inhale 2 puffs into the lungs 2 (two) times daily.    [provider]  buprenorphine-naloxone (SUBOXONE) 8-2 MG SUBL SL tablet Place 1 tablet under the tongue 3 (three) times daily.    [provider]  famotidine (PEPCID) 20 MG tablet Take 1 tablet (20 mg total) by mouth 2 (two) times daily. 02/10/21   Sharman Cheek, MD  ferrous sulfate (EQL SLOW RELEASE IRON) 160 (50 Fe) MG TBCR SR tablet Take 1 tablet (160 mg total) by mouth daily. 12/27/15   Emily Filbert, MD  FLUoxetine (PROZAC) 40 MG capsule Take 40 mg by mouth daily.    [provider]  furosemide (LASIX) 20 MG tablet Take 1 tablet (20 mg total) by mouth daily. 01/03/16 01/02/17  Arnaldo Natal, MD  gabapentin (NEURONTIN) 300 MG capsule Take 300 mg by mouth 3 (three) times daily.    [provider]  HYDROcodone-acetaminophen (NORCO/VICODIN) 5-325 MG tablet Take 1 tablet by mouth every 6 (six) hours as needed for moderate pain. 12/08/20   Tommi Rumps, PA-C  LORazepam (ATIVAN) 1 MG tablet Take 1 tablet (1 mg total) by mouth 2 (two) times daily  as needed for anxiety. 11/01/16   Jennye Moccasin, MD  naproxen (NAPROSYN) 500 MG tablet Take 1 tablet (500 mg total) by mouth 2 (two) times daily with a meal. 02/10/21   Sharman Cheek, MD  ondansetron (ZOFRAN ODT) 4 MG disintegrating tablet Take 1 tablet (4 mg total) by mouth every 8 (eight) hours as needed for nausea or vomiting. 02/10/21   Sharman Cheek, MD  pantoprazole (PROTONIX) 40 MG tablet Take 40 mg by mouth daily.    [provider]  spironolactone (ALDACTONE) 25 MG tablet Take 25 mg by mouth daily.     [provider]  traZODone (DESYREL) 100 MG tablet Take 100 mg by mouth at bedtime.    [provider]  potassium chloride (K-DUR) 10 MEQ tablet Take 1 tablet (10 mEq total) by mouth daily. 06/19/17 12/08/20  Emily Filbert, MD    Allergies Tramadol and Erythromycin  Family History  Problem Relation Age of Onset  . CAD Mother     Social History Social History   Tobacco Use  . Smoking status: Current Every Day Smoker    Packs/day: 0.50    Types: Cigarettes    Last attempt to quit: 09/17/2016    Years since quitting: 4.5  . Smokeless tobacco: Never Used  Substance Use Topics  . Alcohol use: No  . Drug use: No    Review of Systems  Constitutional: No fever/chills Eyes: No visual changes. ENT: No sore throat. Cardiovascular: Denies chest pain. Respiratory: Denies shortness of breath. Gastrointestinal: No abdominal pain.  No nausea, no vomiting.  No diarrhea.  No constipation. Genitourinary: Negative for dysuria. Musculoskeletal: Negative for back pain. Skin: Negative for rash. Neurological: Negative for focal weakness or numbness.  Positive for headache  ____________________________________________   PHYSICAL EXAM:  VITAL SIGNS: Vitals:   03/20/21 0931 03/20/21 1101  BP: 138/85 140/88  Pulse: (!) 115 (!) 102  Resp: 14 15  Temp: 98 F (36.7 C)   SpO2: 95% 94%     Constitutional: Alert and oriented. Well appearing and in no acute distress. Eyes: Conjunctivae are normal. PERRL. EOMI. Head: Atraumatic. Nose: No congestion/rhinnorhea. Mouth/Throat: Mucous membranes are moist.  Oropharynx non-erythematous. Neck: No stridor. No cervical spine tenderness to palpation. Cardiovascular: Normal rate, regular rhythm. Grossly normal heart sounds.  Good peripheral circulation. Respiratory: Normal respiratory effort.  No retractions. Lungs CTAB. Gastrointestinal: Soft , nondistended, nontender to palpation. No CVA tenderness. Musculoskeletal: No  lower extremity tenderness nor edema.  No joint effusions. No signs of acute trauma. Neurologic:  Normal speech and language. No gross focal neurologic deficits are appreciated. No gait instability noted. Cranial nerves II through XII intact 5/5 strength and sensation in all 4 extremities Skin:  Skin is warm, dry and intact. No rash noted. Psychiatric: Mood and affect are normal. Speech and behavior are normal.  ____________________________________________   LABS (all labs ordered are listed, but only abnormal results are displayed)  Labs Reviewed  COMPREHENSIVE METABOLIC PANEL - Abnormal; Notable for the following components:      Result Value   Potassium 3.1 (*)    Chloride 96 (*)    Glucose, Bld 214 (*)    Calcium 8.5 (*)    Albumin 3.3 (*)    All other components within normal limits  CBC WITH DIFFERENTIAL/PLATELET   ____________________________________________  12 Lead EKG  Sinus rhythm, rate of 113 bpm.  Normal axis and intervals.  No evidence of acute ischemia.  Sinus tachycardia.  ____________________________________________   PROCEDURES and  INTERVENTIONS  Procedure(s) performed (including Critical Care):  .1-3 Lead EKG Interpretation Performed by: Delton Prairie, MD Authorized by: Delton Prairie, MD     Interpretation: abnormal     ECG rate:  110   ECG rate assessment: tachycardic     Rhythm: sinus tachycardia     Ectopy: none     Conduction: normal      Medications  lactated ringers bolus 1,000 mL (0 mLs Intravenous Stopped 03/20/21 1115)  butalbital-acetaminophen-caffeine (FIORICET) 50-325-40 MG per tablet 2 tablet (2 tablets Oral Given 03/20/21 1037)    ____________________________________________   MDM / ED COURSE   50 year old woman with history of polysubstance abuse presents to the ED after likely accidental opiate overdose, and ultimately amenable to outpatient management.  Presents tachycardic, resolving with IVF, and otherwise hemodynamically  stable.  Exam with dry mucous membranes and her tachycardia, otherwise she looks well without distress, signs of trauma or any neurovascular deficits.  Blood work with mild hypokalemia and hyperglycemia, for which she received a liter of LR.  Maintained on the monitor without issues on telemetry.  EKG is nonischemic without interval changes to suggest cardiac syncope.  Her story is most suggestive of accidental opiate overdose, despite her denying additional opiate ingestion.  She denies suicidality and I see no indications for IVC or emergent psychiatric evaluation.  Patient ambulatory and tolerating p.o. intake and requesting discharge.  We discussed return precautions for the ED and patient stable for outpatient management   Clinical Course as of 03/20/21 1217  Sun Mar 20, 2021  1149 Reassessed.  Patient reports resolution of headache and is demanding to leave.  Her boyfriend has been making a scene, storming into the hallway and yelling that we are not caring for the patient.  He has been escorted out of the ED by security [DS]  1150 Patient again denies suicidality or drug ingestions beyond her Xanax and Suboxone.  She reports that she feels well and wants to go home. [DS]    Clinical Course User Index [DS] Delton Prairie, MD    ____________________________________________   FINAL CLINICAL IMPRESSION(S) / ED DIAGNOSES  Final diagnoses:  Opiate overdose, accidental or unintentional, initial encounter [facility])     ED Discharge Orders    None       Denise Valentine   Note:  This document was prepared using Dragon voice recognition software and may include unintentional dictation errors.   Delton Prairie, MD 03/20/21 [id number]

## 2021-03-20 NOTE — ED Notes (Signed)
pts family member cursing at medical staff, security called

## 2021-03-20 NOTE — ED Notes (Signed)
Pt in room coughing , pts friend came to nurses station stating she wanted to be discharged , pts vital sign WDL,

## 2021-07-13 ENCOUNTER — Ambulatory Visit: Payer: Self-pay

## 2022-02-16 ENCOUNTER — Encounter: Payer: Self-pay | Admitting: Gastroenterology

## 2022-02-16 NOTE — H&P (Incomplete)
Pre-Procedure H&P   Patient ID: Denise Valentine is a 51 y.o. female.  Gastroenterology Provider: Jaynie Collins, DO  Referring Provider: Dr. Greggory Stallion PCP: Rayetta Humphrey, MD  Date: 02/16/2022  HPI Ms. Denise Valentine is a 51 y.o. female who presents today for Colonoscopy for initial screening colonoscopy. Patient with a history of polysubstance abuse, active tobacco use, bipolar disorder undergoing initial screening colonoscopy.  No family history of colorectal cancer or polyps.  Patient with history of hepatitis C status posttreatment with most recent PCR - August 11, 2021.  LFTs within normal limits Patient with normal bowel movements without melena hematochezia constipation or diarrhea. EGD in March 2016 demonstrating grade D esophagitis hiatal hernia and esophageal ulcers.  No other acute GI complaints  Past Medical History:  Diagnosis Date   ADHD (attention deficit hyperactivity disorder)    Anxiety    Arthritis    Asthma    Bipolar disorder (HCC)    COPD (chronic obstructive pulmonary disease) (HCC)    Coronary artery disease    Depression    DVT (deep venous thrombosis) (HCC)    GERD (gastroesophageal reflux disease)    Headache    migraines   Hepatitis C    Ovarian cyst    Seizures (HCC)    Sleep apnea    no cpap   Substance abuse (HCC)     Past Surgical History:  Procedure Laterality Date   BACK SURGERY     CHOLECYSTECTOMY     TONSILLECTOMY     TUBAL LIGATION     WISDOM TOOTH EXTRACTION      Family History No h/o GI disease or malignancy  Review of Systems  Constitutional:  Negative for activity change, appetite change, chills, diaphoresis, fatigue, fever and unexpected weight change.  HENT:  Negative for trouble swallowing and voice change.   Respiratory:  Negative for shortness of breath and wheezing.   Cardiovascular:  Negative for chest pain, palpitations and leg swelling.  Gastrointestinal:  Negative for abdominal distention, abdominal pain,  anal bleeding, blood in stool, constipation, diarrhea, nausea, rectal pain and vomiting.  Musculoskeletal:  Negative for arthralgias and myalgias.  Skin:  Negative for color change and pallor.  Neurological:  Negative for dizziness, syncope and weakness.  Psychiatric/Behavioral:  Negative for confusion.   All other systems reviewed and are negative.   Medications No current facility-administered medications on file prior to encounter.   Current Outpatient Medications on File Prior to Encounter  Medication Sig Dispense Refill   arformoterol (BROVANA) 15 MCG/2ML NEBU Take 15 mcg by nebulization 2 (two) times daily.     budesonide (PULMICORT) 0.25 MG/2ML nebulizer solution Take 0.25 mg by nebulization 2 (two) times daily.     buPROPion (WELLBUTRIN XL) 150 MG 24 hr tablet Take 150 mg by mouth daily.     clonazePAM (KLONOPIN) 0.5 MG tablet Take 0.5 mg by mouth 3 (three) times daily.     ergocalciferol (VITAMIN D2) 1.25 MG (50000 UT) capsule Take 50,000 Units by mouth once a week.     ibuprofen (ADVIL) 800 MG tablet Take 800 mg by mouth every 8 (eight) hours as needed.     methadone (DOLOPHINE) 10 MG tablet Take 60 mg by mouth daily.     tiotropium (SPIRIVA) 18 MCG inhalation capsule Place 18 mcg into inhaler and inhale daily.     ADVAIR DISKUS 250-50 MCG/DOSE AEPB Inhale 1 puff into the lungs 2 (two) times daily.  0   albuterol (PROVENTIL) (2.5 MG/3ML) 0.083%  nebulizer solution Inhale 3 mLs into the lungs every 6 (six) hours as needed.  1   budesonide-formoterol (SYMBICORT) 160-4.5 MCG/ACT inhaler Inhale 2 puffs into the lungs 2 (two) times daily.     buprenorphine-naloxone (SUBOXONE) 8-2 MG SUBL SL tablet Place 1 tablet under the tongue 3 (three) times daily.     famotidine (PEPCID) 20 MG tablet Take 1 tablet (20 mg total) by mouth 2 (two) times daily. 60 tablet 0   ferrous sulfate (EQL SLOW RELEASE IRON) 160 (50 Fe) MG TBCR SR tablet Take 1 tablet (160 mg total) by mouth daily. 30 each 6    FLUoxetine (PROZAC) 40 MG capsule Take 40 mg by mouth daily.     furosemide (LASIX) 20 MG tablet Take 1 tablet (20 mg total) by mouth daily. 30 tablet 11   gabapentin (NEURONTIN) 300 MG capsule Take 300 mg by mouth 3 (three) times daily.     HYDROcodone-acetaminophen (NORCO/VICODIN) 5-325 MG tablet Take 1 tablet by mouth every 6 (six) hours as needed for moderate pain. 12 tablet 0   LORazepam (ATIVAN) 1 MG tablet Take 1 tablet (1 mg total) by mouth 2 (two) times daily as needed for anxiety. 10 tablet 0   naproxen (NAPROSYN) 500 MG tablet Take 1 tablet (500 mg total) by mouth 2 (two) times daily with a meal. 20 tablet 0   ondansetron (ZOFRAN ODT) 4 MG disintegrating tablet Take 1 tablet (4 mg total) by mouth every 8 (eight) hours as needed for nausea or vomiting. 20 tablet 0   pantoprazole (PROTONIX) 40 MG tablet Take 40 mg by mouth daily.     spironolactone (ALDACTONE) 25 MG tablet Take 25 mg by mouth daily.     traZODone (DESYREL) 100 MG tablet Take 100 mg by mouth at bedtime.     [DISCONTINUED] potassium chloride (K-DUR) 10 MEQ tablet Take 1 tablet (10 mEq total) by mouth daily. 7 tablet 0    Pertinent medications related to GI and procedure were reviewed by me with the patient prior to the procedure  No current facility-administered medications for this encounter.  Current Outpatient Medications:    arformoterol (BROVANA) 15 MCG/2ML NEBU, Take 15 mcg by nebulization 2 (two) times daily., Disp: , Rfl:    budesonide (PULMICORT) 0.25 MG/2ML nebulizer solution, Take 0.25 mg by nebulization 2 (two) times daily., Disp: , Rfl:    buPROPion (WELLBUTRIN XL) 150 MG 24 hr tablet, Take 150 mg by mouth daily., Disp: , Rfl:    clonazePAM (KLONOPIN) 0.5 MG tablet, Take 0.5 mg by mouth 3 (three) times daily., Disp: , Rfl:    ergocalciferol (VITAMIN D2) 1.25 MG (50000 UT) capsule, Take 50,000 Units by mouth once a week., Disp: , Rfl:    ibuprofen (ADVIL) 800 MG tablet, Take 800 mg by mouth every 8 (eight)  hours as needed., Disp: , Rfl:    methadone (DOLOPHINE) 10 MG tablet, Take 60 mg by mouth daily., Disp: , Rfl:    tiotropium (SPIRIVA) 18 MCG inhalation capsule, Place 18 mcg into inhaler and inhale daily., Disp: , Rfl:    ADVAIR DISKUS 250-50 MCG/DOSE AEPB, Inhale 1 puff into the lungs 2 (two) times daily., Disp: , Rfl: 0   albuterol (PROVENTIL) (2.5 MG/3ML) 0.083% nebulizer solution, Inhale 3 mLs into the lungs every 6 (six) hours as needed., Disp: , Rfl: 1   budesonide-formoterol (SYMBICORT) 160-4.5 MCG/ACT inhaler, Inhale 2 puffs into the lungs 2 (two) times daily., Disp: , Rfl:    buprenorphine-naloxone (SUBOXONE) 8-2 MG  SUBL SL tablet, Place 1 tablet under the tongue 3 (three) times daily., Disp: , Rfl:    famotidine (PEPCID) 20 MG tablet, Take 1 tablet (20 mg total) by mouth 2 (two) times daily., Disp: 60 tablet, Rfl: 0   ferrous sulfate (EQL SLOW RELEASE IRON) 160 (50 Fe) MG TBCR SR tablet, Take 1 tablet (160 mg total) by mouth daily., Disp: 30 each, Rfl: 6   FLUoxetine (PROZAC) 40 MG capsule, Take 40 mg by mouth daily., Disp: , Rfl:    furosemide (LASIX) 20 MG tablet, Take 1 tablet (20 mg total) by mouth daily., Disp: 30 tablet, Rfl: 11   gabapentin (NEURONTIN) 300 MG capsule, Take 300 mg by mouth 3 (three) times daily., Disp: , Rfl:    HYDROcodone-acetaminophen (NORCO/VICODIN) 5-325 MG tablet, Take 1 tablet by mouth every 6 (six) hours as needed for moderate pain., Disp: 12 tablet, Rfl: 0   LORazepam (ATIVAN) 1 MG tablet, Take 1 tablet (1 mg total) by mouth 2 (two) times daily as needed for anxiety., Disp: 10 tablet, Rfl: 0   naproxen (NAPROSYN) 500 MG tablet, Take 1 tablet (500 mg total) by mouth 2 (two) times daily with a meal., Disp: 20 tablet, Rfl: 0   ondansetron (ZOFRAN ODT) 4 MG disintegrating tablet, Take 1 tablet (4 mg total) by mouth every 8 (eight) hours as needed for nausea or vomiting., Disp: 20 tablet, Rfl: 0   pantoprazole (PROTONIX) 40 MG tablet, Take 40 mg by mouth daily.,  Disp: , Rfl:    spironolactone (ALDACTONE) 25 MG tablet, Take 25 mg by mouth daily., Disp: , Rfl:    traZODone (DESYREL) 100 MG tablet, Take 100 mg by mouth at bedtime., Disp: , Rfl:       Allergies  Allergen Reactions   Carbamazepine    Tramadol Hives   Erythromycin Rash   Allergies were reviewed by me prior to the procedure  Objective    There were no vitals filed for this visit.  *** Physical Exam Vitals and nursing note reviewed.  Constitutional:      General: She is not in acute distress.    Appearance: Normal appearance. She is not ill-appearing, toxic-appearing or diaphoretic.  HENT:     Head: Normocephalic and atraumatic.     Nose: Nose normal.     Mouth/Throat:     Mouth: Mucous membranes are moist.     Pharynx: Oropharynx is clear.  Eyes:     General: No scleral icterus.    Extraocular Movements: Extraocular movements intact.  Cardiovascular:     Rate and Rhythm: Normal rate and regular rhythm.     Heart sounds: Normal heart sounds. No murmur heard.   No friction rub. No gallop.  Pulmonary:     Effort: Pulmonary effort is normal. No respiratory distress.     Breath sounds: Normal breath sounds. No wheezing, rhonchi or rales.  Abdominal:     General: Bowel sounds are normal. There is no distension.     Palpations: Abdomen is soft.     Tenderness: There is no abdominal tenderness. There is no guarding or rebound.  Musculoskeletal:     Cervical back: Neck supple.     Right lower leg: No edema.     Left lower leg: No edema.  Skin:    General: Skin is warm and dry.     Coloration: Skin is not jaundiced or pale.  Neurological:     General: No focal deficit present.     Mental Status: She  is alert and oriented to person, place, and time. Mental status is at baseline.  Psychiatric:        Mood and Affect: Mood normal.        Behavior: Behavior normal.        Thought Content: Thought content normal.        Judgment: Judgment normal.     Assessment:   Denise Valentine is a 51 y.o. female  who presents today for Colonoscopy for initial screening colonoscopy.  Plan:  Colonoscopy with possible intervention today  Colonoscopy with possible biopsy, control of bleeding, polypectomy, and interventions as necessary has been discussed with the patient/patient representative. Informed consent was obtained from the patient/patient representative after explaining the indication, nature, and risks of the procedure including but not limited to death, bleeding, perforation, missed neoplasm/lesions, cardiorespiratory compromise, and reaction to medications. Opportunity for questions was given and appropriate answers were provided. Patient/patient representative has verbalized understanding is amenable to undergoing the procedure.   Jaynie CollinsSteven Michael Unita Detamore, DO  [facility]  Portions of the record may have been created with voice recognition software. Occasional wrong-word or 'sound-a-like' substitutions may have occurred due to the inherent limitations of voice recognition software.  Read the chart carefully and recognize, using context, where substitutions may have occurred.

## 2022-02-17 ENCOUNTER — Ambulatory Visit: Admission: RE | Admit: 2022-02-17 | Payer: Medicaid Other | Source: Ambulatory Visit | Admitting: Gastroenterology

## 2022-02-17 ENCOUNTER — Encounter: Admission: RE | Payer: Self-pay | Source: Ambulatory Visit

## 2022-02-17 ENCOUNTER — Encounter: Payer: Self-pay | Admitting: Anesthesiology

## 2022-02-17 HISTORY — DX: Unspecified osteoarthritis, unspecified site: M19.90

## 2022-02-17 HISTORY — DX: Attention-deficit hyperactivity disorder, unspecified type: F90.9

## 2022-02-17 HISTORY — DX: Other psychoactive substance abuse, uncomplicated: F19.10

## 2022-02-17 HISTORY — DX: Atherosclerotic heart disease of native coronary artery without angina pectoris: I25.10

## 2022-02-17 HISTORY — DX: Bipolar disorder, unspecified: F31.9

## 2022-02-17 HISTORY — DX: Sleep apnea, unspecified: G47.30

## 2022-02-17 HISTORY — DX: Headache, unspecified: R51.9

## 2022-02-17 SURGERY — COLONOSCOPY WITH PROPOFOL
Anesthesia: General

## 2022-02-17 NOTE — Anesthesia Preprocedure Evaluation (Deleted)
Anesthesia Evaluation  ?Patient identified by MRN, date of birth, ID band ?Patient awake ? ? ? ?Reviewed: ?Allergy & Precautions, NPO status , Patient's Chart, lab work & pertinent test results ? ?Airway ?Mallampati: II ? ?TM Distance: >3 FB ?Neck ROM: full ? ? ? Dental ?no notable dental hx. ? ?  ?Pulmonary ?sleep apnea and Continuous Positive Airway Pressure Ventilation , COPD,  COPD inhaler, former smoker,  ?  ?Pulmonary exam normal ? ? ? ? ? ? ? Cardiovascular ?+ CAD  ?Normal cardiovascular exam ? ?H/o DVT ?  ?Neuro/Psych ?Seizures -,  PSYCHIATRIC DISORDERS Depression Bipolar Disorder   ? GI/Hepatic ?GERD  ,(+)  ?  ? substance abuse (h/o marijuana and cocaine use) ? , History of hepatitis C status posttreatment with most recent PCR - August 11, 2021.  LFTs within normal limits ?EGD in March 2016 demonstrating grade D esophagitis hiatal hernia and esophageal ulcers ?  ?Endo/Other  ?negative endocrine ROS ? Renal/GU ?negative Renal ROS  ?negative genitourinary ?  ?Musculoskeletal ? ? Abdominal ?  ?Peds ? Hematology ?negative hematology ROS ?(+)   ?Anesthesia Other Findings ?Past Medical History: ?No date: ADHD (attention deficit hyperactivity disorder) ?No date: Anxiety ?No date: Arthritis ?No date: Asthma ?No date: Bipolar disorder (HCC) ?No date: COPD (chronic obstructive pulmonary disease) (HCC) ?No date: Coronary artery disease ?No date: Depression ?No date: DVT (deep venous thrombosis) (HCC) ?No date: GERD (gastroesophageal reflux disease) ?No date: Headache ?    Comment:  migraines ?No date: Hepatitis C ?No date: Ovarian cyst ?No date: Seizures (HCC) ?No date: Sleep apnea ?    Comment:  no cpap ?No date: Substance abuse (HCC) ? ?Past Surgical History: ?No date: BACK SURGERY ?No date: CHOLECYSTECTOMY ?No date: TONSILLECTOMY ?No date: TUBAL LIGATION ?No date: WISDOM TOOTH EXTRACTION ? ? ? ? Reproductive/Obstetrics ?negative OB ROS ? ?  ? ? ? ? ? ? ? ? ? ? ? ? ? ?  ?   ? ? ? ? ? ? ? ?Anesthesia Physical ?Anesthesia Plan ? ?ASA: 3 ? ?Anesthesia Plan: General  ? ?Post-op Pain Management:   ? ?Induction:  ? ?PONV Risk Score and Plan: Propofol infusion and TIVA ? ?Airway Management Planned:  ? ?Additional Equipment:  ? ?Intra-op Plan:  ? ?Post-operative Plan:  ? ?Informed Consent:  ? ?Plan Discussed with: Anesthesiologist, CRNA and Surgeon ? ?Anesthesia Plan Comments:   ? ? ? ? ? ?Anesthesia Quick Evaluation ? ?

## 2022-04-13 ENCOUNTER — Other Ambulatory Visit: Payer: Self-pay

## 2022-04-13 ENCOUNTER — Encounter: Payer: Self-pay | Admitting: Emergency Medicine

## 2022-04-13 ENCOUNTER — Emergency Department
Admission: EM | Admit: 2022-04-13 | Discharge: 2022-04-13 | Discharge status: Against Medical Advice | Payer: Medicaid Other | Attending: Emergency Medicine | Admitting: Emergency Medicine

## 2022-04-13 DIAGNOSIS — R1032 Left lower quadrant pain: Secondary | ICD-10-CM | POA: Diagnosis not present

## 2022-04-13 DIAGNOSIS — R110 Nausea: Secondary | ICD-10-CM | POA: Insufficient documentation

## 2022-04-13 DIAGNOSIS — Z5321 Procedure and treatment not carried out due to patient leaving prior to being seen by health care provider: Secondary | ICD-10-CM | POA: Diagnosis not present

## 2022-04-13 LAB — CBC
HCT: 42.5 % (ref 36.0–46.0)
Hemoglobin: 13.6 g/dL (ref 12.0–15.0)
MCH: 28.2 pg (ref 26.0–34.0)
MCHC: 32 g/dL (ref 30.0–36.0)
MCV: 88.2 fL (ref 80.0–100.0)
Platelets: 257 10*3/uL (ref 150–400)
RBC: 4.82 MIL/uL (ref 3.87–5.11)
RDW: 13.1 % (ref 11.5–15.5)
WBC: 7.5 10*3/uL (ref 4.0–10.5)
nRBC: 0 % (ref 0.0–0.2)

## 2022-04-13 LAB — COMPREHENSIVE METABOLIC PANEL
ALT: 9 U/L (ref 0–44)
AST: 14 U/L — ABNORMAL LOW (ref 15–41)
Albumin: 3.8 g/dL (ref 3.5–5.0)
Alkaline Phosphatase: 78 U/L (ref 38–126)
Anion gap: 7 (ref 5–15)
BUN: 8 mg/dL (ref 6–20)
CO2: 30 mmol/L (ref 22–32)
Calcium: 9.1 mg/dL (ref 8.9–10.3)
Chloride: 101 mmol/L (ref 98–111)
Creatinine, Ser: 0.76 mg/dL (ref 0.44–1.00)
GFR, Estimated: >60 mL/min (ref >60)
Glucose, Bld: 77 mg/dL (ref 70–99)
Potassium: 3.9 mmol/L (ref 3.5–5.1)
Sodium: 138 mmol/L (ref 135–145)
Total Bilirubin: 0.4 mg/dL (ref 0.3–1.2)
Total Protein: 7.2 g/dL (ref 6.5–8.1)

## 2022-04-13 LAB — URINALYSIS, ROUTINE W REFLEX MICROSCOPIC
Bilirubin Urine: NEGATIVE
Glucose, UA: NEGATIVE mg/dL
Hgb urine dipstick: NEGATIVE
Ketones, ur: NEGATIVE mg/dL
Leukocytes,Ua: NEGATIVE
Nitrite: NEGATIVE
Protein, ur: NEGATIVE mg/dL
Specific Gravity, Urine: 1.003 — ABNORMAL LOW (ref 1.005–1.030)
pH: 6 (ref 5.0–8.0)

## 2022-04-13 LAB — LIPASE, BLOOD: Lipase: 28 U/L (ref 11–51)

## 2022-04-13 NOTE — ED Triage Notes (Signed)
Pt to ED via POV c/o mid left sided abdominal pain. Pt states that she has been having pain and nausea for about 2 days. Pt denies diarrhea or vomiting but states that she has a full feeling in her abdomen. Pt denies fevers or chills. Pt is currently in NAD. ?

## 2022-04-26 ENCOUNTER — Other Ambulatory Visit: Payer: Self-pay | Admitting: Orthopedic Surgery

## 2022-04-26 DIAGNOSIS — M249 Joint derangement, unspecified: Secondary | ICD-10-CM

## 2022-05-02 ENCOUNTER — Other Ambulatory Visit (HOSPITAL_COMMUNITY): Payer: Self-pay | Admitting: Orthopedic Surgery

## 2022-05-02 ENCOUNTER — Other Ambulatory Visit: Payer: Self-pay | Admitting: Orthopedic Surgery

## 2022-05-02 DIAGNOSIS — M24812 Other specific joint derangements of left shoulder, not elsewhere classified: Secondary | ICD-10-CM

## 2022-05-03 ENCOUNTER — Other Ambulatory Visit: Payer: Self-pay

## 2022-05-03 ENCOUNTER — Encounter: Payer: Self-pay | Admitting: Emergency Medicine

## 2022-05-03 ENCOUNTER — Emergency Department: Payer: Medicaid Other

## 2022-05-03 ENCOUNTER — Emergency Department
Admission: EM | Admit: 2022-05-03 | Discharge: 2022-05-03 | Disposition: A | Discharge status: Home / Self Care | Payer: Medicaid Other | Attending: Emergency Medicine | Admitting: Emergency Medicine

## 2022-05-03 DIAGNOSIS — Y9389 Activity, other specified: Secondary | ICD-10-CM | POA: Insufficient documentation

## 2022-05-03 DIAGNOSIS — S93402A Sprain of unspecified ligament of left ankle, initial encounter: Secondary | ICD-10-CM | POA: Diagnosis not present

## 2022-05-03 DIAGNOSIS — S62102A Fracture of unspecified carpal bone, left wrist, initial encounter for closed fracture: Secondary | ICD-10-CM

## 2022-05-03 DIAGNOSIS — S52502A Unspecified fracture of the lower end of left radius, initial encounter for closed fracture: Secondary | ICD-10-CM | POA: Diagnosis not present

## 2022-05-03 DIAGNOSIS — W01198A Fall on same level from slipping, tripping and stumbling with subsequent striking against other object, initial encounter: Secondary | ICD-10-CM | POA: Insufficient documentation

## 2022-05-03 DIAGNOSIS — S6992XA Unspecified injury of left wrist, hand and finger(s), initial encounter: Secondary | ICD-10-CM | POA: Diagnosis present

## 2022-05-03 DIAGNOSIS — J449 Chronic obstructive pulmonary disease, unspecified: Secondary | ICD-10-CM | POA: Insufficient documentation

## 2022-05-03 MED ORDER — MORPHINE SULFATE (PF) 4 MG/ML IV SOLN
4.0000 mg | Freq: Once | INTRAVENOUS | Status: AC
  Administered 2022-05-03: 4 mg via INTRAVENOUS
  Filled 2022-05-03: qty 1

## 2022-05-03 MED ORDER — ONDANSETRON HCL 4 MG/2ML IJ SOLN
4.0000 mg | Freq: Once | INTRAMUSCULAR | Status: AC
  Administered 2022-05-03: 4 mg via INTRAVENOUS
  Filled 2022-05-03: qty 2

## 2022-05-03 MED ORDER — HYDROCODONE-ACETAMINOPHEN 5-325 MG PO TABS
1.0000 | ORAL_TABLET | ORAL | 0 refills | Status: AC | PRN
Start: 2022-05-03 — End: 2023-05-03

## 2022-05-03 NOTE — ED Triage Notes (Signed)
Pt to ED via [facility] from home for Left wrist and ankle injury. Pt states that she was cutting her son's hair and she tripped over the cord and fell. Pt has pain and deformity to her left wrist. Pt also has pain and swelling in the left ankle. Pt was given 100 mcg of Fentanyl by EMS. Pt is A & O at this time. Pt states that she did not hit her head. Pt denies LOC.   ?

## 2022-05-03 NOTE — Discharge Instructions (Signed)
Please take your pain medication as needed but only as written.  Do not drink alcohol or drive while taking this medication.  Please follow-up with orthopedics by calling the number provided to arrange a follow-up appointment in approximately 1 week.  Please leave your splint in place until you have been seen by orthopedics.  You may remove your left ankle brace as needed and wear during the day for comfort. ?

## 2022-05-03 NOTE — ED Provider Notes (Signed)
? ?[facility] ?Provider Note ? ? ? Event Date/Time  ? First MD Initiated Contact with Patient 05/03/22 [phone]   ?  (approximate) ? ?History  ? ?Chief Complaint: Fall, Wrist Injury, and Ankle Injury ? ?HPI ? ?Denise Valentine is a 51 y.o. female with a past medical history of ADHD, anxiety, bipolar, COPD, presents to the emergency department after falling off of her porch.  According to the patient she was outside on the porch cutting her son's hair when she tripped over the cord causing her to fall.  Patient states pain to her outer left ankle as well as her left wrist.  Did not hit her head.  No LOC.  No anticoagulation.  No other pain complaints besides left wrist and left ankle. ? ?Physical Exam  ? ?Triage Vital Signs: ?ED Triage Vitals  ?Enc Vitals Group  ?   BP 05/03/22 0953 119/83  ?   Pulse Rate 05/03/22 0953 73  ?   Resp 05/03/22 0953 15  ?   Temp 05/03/22 0953 98.4 ?F (36.9 ?C)  ?   Temp Source 05/03/22 0953 Oral  ?   SpO2 05/03/22 0945 96 %  ?   Weight 05/03/22 0950 189 lb (85.7 kg)  ?   Height 05/03/22 0950 5\' 7"  (1.702 m)  ?   Head Circumference --   ?   Peak Flow --   ?   Pain Score 05/03/22 0950 10  ?   Pain Loc --   ?   Pain Edu? --   ?   Excl. in GC? --   ? ? ?Most recent vital signs: ?Vitals:  ? 05/03/22 0945 05/03/22 0953  ?BP:  119/83  ?Pulse:  73  ?Resp:  15  ?Temp:  98.4 ?F (36.9 ?C)  ?SpO2: 96% 98%  ? ? ?General: Awake, no distress.  ?CV:  Good peripheral perfusion.  Regular rate and rhythm  ?Resp:  Normal effort.  Equal breath sounds bilaterally.  ?Abd:  No distention.  Soft, nontender.  No rebound or guarding. ?Other:  Moderate tenderness palpation of the left wrist with possible mild deformity over the radial aspect.  Neurovascular intact distally.  Ankle does not appear obviously deformed neurovascular intact distally with 2+ DP pulse.  Tenderness to palpation over the lateral aspect. ? ? ?ED Results / Procedures / Treatments  ? ?RADIOLOGY ? ?I have interpreted the x-ray  images, patient appears to have an impacted distal radius fracture.  No angulation noted. ?Radiology is read the x-rays as impacted distal radius fracture.  Negative x-ray of the ankle. ? ?MEDICATIONS ORDERED IN ED: ?Medications  ?morphine (PF) 4 MG/ML injection 4 mg (has no administration in time range)  ?ondansetron (ZOFRAN) injection 4 mg (has no administration in time range)  ? ? ? ?IMPRESSION / MDM / ASSESSMENT AND PLAN / ED COURSE  ?I reviewed the triage vital signs and the nursing notes. ? ?Patient presents emergency department after an accidental fall after tripping on a cord.  Patient is having left ankle and left wrist pain.  Differential would include contusion, fracture or dislocation.  On examination the wrist is most consistent with possible distal radius fracture.  No obvious deformity in the ankle but could be consistent with sprain or fracture.  We will treat pain nausea while awaiting x-ray images.  Patient agreeable to plan of care. ? ?Patient's x-rays consistent with distal impacted radius fracture.  Negative ankle x-ray.  No angulation of the wrist does not appear to  involve the articulating surface.  We will place the patient in a sugar-tong splint and have the patient follow-up with radiology in 1 week.  Patient agreeable to plan.  We will place the patient in a removable left ankle brace.  We will provide a short course of pain medication for the patient. ? ?FINAL CLINICAL IMPRESSION(S) / ED DIAGNOSES  ? ?Left wrist fracture. ?Left ankle sprain ? ? ?Note:  This document was prepared using Dragon voice recognition software and may include unintentional dictation errors. ?  Minna Antis, MD ?05/03/22 1035 ? ?

## 2022-05-11 ENCOUNTER — Encounter (HOSPITAL_COMMUNITY): Payer: Self-pay | Admitting: *Deleted

## 2022-05-11 ENCOUNTER — Other Ambulatory Visit: Payer: Self-pay

## 2022-05-11 NOTE — Progress Notes (Signed)
Denise Valentine denies chest pain or shortness of breath. Patient denies having any s/s of Covid in her household.  Patient denies any known exposure to Covid.   Denise Valentine' s Drs PCP is Dr. Hezzie Bump Pulmonologist is Dr. Kathie Rhodes. Denise Valentine Endocrinologist is Dr. Odis Luster.  Denise Valentine will be clean for 1 year on June 4th, 23.

## 2022-05-16 ENCOUNTER — Ambulatory Visit (HOSPITAL_BASED_OUTPATIENT_CLINIC_OR_DEPARTMENT_OTHER): Payer: Medicaid Other | Admitting: Certified Registered Nurse Anesthetist

## 2022-05-16 ENCOUNTER — Other Ambulatory Visit: Payer: Self-pay

## 2022-05-16 ENCOUNTER — Encounter (HOSPITAL_COMMUNITY): Payer: Self-pay

## 2022-05-16 ENCOUNTER — Ambulatory Visit (HOSPITAL_COMMUNITY)
Admission: RE | Admit: 2022-05-16 | Discharge: 2022-05-16 | Disposition: A | Discharge status: Home / Self Care | Payer: Medicaid Other | Attending: Orthopedic Surgery | Admitting: Orthopedic Surgery

## 2022-05-16 ENCOUNTER — Ambulatory Visit (HOSPITAL_COMMUNITY): Payer: Medicaid Other | Admitting: Certified Registered Nurse Anesthetist

## 2022-05-16 ENCOUNTER — Ambulatory Visit (HOSPITAL_COMMUNITY)
Admission: RE | Admit: 2022-05-16 | Discharge: 2022-05-16 | Disposition: A | Discharge status: Home / Self Care | Payer: Medicaid Other | Source: Ambulatory Visit | Attending: Orthopedic Surgery | Admitting: Orthopedic Surgery

## 2022-05-16 ENCOUNTER — Encounter (HOSPITAL_COMMUNITY): Admission: RE | Disposition: A | Discharge status: Home / Self Care | Payer: Self-pay | Source: Home / Self Care

## 2022-05-16 DIAGNOSIS — M67814 Other specified disorders of tendon, left shoulder: Secondary | ICD-10-CM | POA: Insufficient documentation

## 2022-05-16 DIAGNOSIS — K219 Gastro-esophageal reflux disease without esophagitis: Secondary | ICD-10-CM | POA: Diagnosis not present

## 2022-05-16 DIAGNOSIS — M25412 Effusion, left shoulder: Secondary | ICD-10-CM | POA: Diagnosis not present

## 2022-05-16 DIAGNOSIS — J439 Emphysema, unspecified: Secondary | ICD-10-CM | POA: Insufficient documentation

## 2022-05-16 DIAGNOSIS — F17210 Nicotine dependence, cigarettes, uncomplicated: Secondary | ICD-10-CM

## 2022-05-16 DIAGNOSIS — I2510 Atherosclerotic heart disease of native coronary artery without angina pectoris: Secondary | ICD-10-CM | POA: Diagnosis not present

## 2022-05-16 DIAGNOSIS — R569 Unspecified convulsions: Secondary | ICD-10-CM | POA: Insufficient documentation

## 2022-05-16 DIAGNOSIS — M24812 Other specific joint derangements of left shoulder, not elsewhere classified: Secondary | ICD-10-CM

## 2022-05-16 DIAGNOSIS — G4730 Sleep apnea, unspecified: Secondary | ICD-10-CM

## 2022-05-16 DIAGNOSIS — M19012 Primary osteoarthritis, left shoulder: Secondary | ICD-10-CM | POA: Diagnosis present

## 2022-05-16 DIAGNOSIS — Y939 Activity, unspecified: Secondary | ICD-10-CM | POA: Insufficient documentation

## 2022-05-16 DIAGNOSIS — F17200 Nicotine dependence, unspecified, uncomplicated: Secondary | ICD-10-CM | POA: Insufficient documentation

## 2022-05-16 DIAGNOSIS — W19XXXA Unspecified fall, initial encounter: Secondary | ICD-10-CM | POA: Diagnosis not present

## 2022-05-16 DIAGNOSIS — J449 Chronic obstructive pulmonary disease, unspecified: Secondary | ICD-10-CM

## 2022-05-16 DIAGNOSIS — M545 Low back pain, unspecified: Secondary | ICD-10-CM | POA: Diagnosis not present

## 2022-05-16 HISTORY — DX: Anemia, unspecified: D64.9

## 2022-05-16 HISTORY — PX: RADIOLOGY WITH ANESTHESIA: SHX6223

## 2022-05-16 HISTORY — DX: Polyneuropathy, unspecified: G62.9

## 2022-05-16 HISTORY — DX: Pneumonia, unspecified organism: J18.9

## 2022-05-16 LAB — CBC
HCT: 39.4 % (ref 36.0–46.0)
Hemoglobin: 13.2 g/dL (ref 12.0–15.0)
MCH: 29.5 pg (ref 26.0–34.0)
MCHC: 33.5 g/dL (ref 30.0–36.0)
MCV: 88.1 fL (ref 80.0–100.0)
Platelets: 227 10*3/uL (ref 150–400)
RBC: 4.47 MIL/uL (ref 3.87–5.11)
RDW: 13.5 % (ref 11.5–15.5)
WBC: 6.2 10*3/uL (ref 4.0–10.5)
nRBC: 0 % (ref 0.0–0.2)

## 2022-05-16 LAB — COMPREHENSIVE METABOLIC PANEL
ALT: 11 U/L (ref 0–44)
AST: 15 U/L (ref 15–41)
Albumin: 3.5 g/dL (ref 3.5–5.0)
Alkaline Phosphatase: 72 U/L (ref 38–126)
Anion gap: 6 (ref 5–15)
BUN: 6 mg/dL (ref 6–20)
CO2: 25 mmol/L (ref 22–32)
Calcium: 8.6 mg/dL — ABNORMAL LOW (ref 8.9–10.3)
Chloride: 105 mmol/L (ref 98–111)
Creatinine, Ser: 0.69 mg/dL (ref 0.44–1.00)
GFR, Estimated: >60 mL/min (ref >60)
Glucose, Bld: 89 mg/dL (ref 70–99)
Potassium: 4 mmol/L (ref 3.5–5.1)
Sodium: 136 mmol/L (ref 135–145)
Total Bilirubin: 0.2 mg/dL — ABNORMAL LOW (ref 0.3–1.2)
Total Protein: 6.3 g/dL — ABNORMAL LOW (ref 6.5–8.1)

## 2022-05-16 SURGERY — MRI WITH ANESTHESIA
Anesthesia: General | Laterality: Left

## 2022-05-16 MED ORDER — PROPOFOL 10 MG/ML IV BOLUS
INTRAVENOUS | Status: DC | PRN
  Administered 2022-05-16: 200 mg via INTRAVENOUS

## 2022-05-16 MED ORDER — FENTANYL CITRATE (PF) 100 MCG/2ML IJ SOLN
INTRAMUSCULAR | Status: DC | PRN
  Administered 2022-05-16 (×2): 50 ug via INTRAVENOUS

## 2022-05-16 MED ORDER — CHLORHEXIDINE GLUCONATE 0.12 % MT SOLN
15.0000 mL | Freq: Once | OROMUCOSAL | Status: AC
  Administered 2022-05-16: 15 mL via OROMUCOSAL
  Filled 2022-05-16: qty 15

## 2022-05-16 MED ORDER — ONDANSETRON HCL 4 MG/2ML IJ SOLN
INTRAMUSCULAR | Status: DC | PRN
  Administered 2022-05-16: 4 mg via INTRAVENOUS

## 2022-05-16 MED ORDER — LIDOCAINE 2% (20 MG/ML) 5 ML SYRINGE
INTRAMUSCULAR | Status: DC | PRN
  Administered 2022-05-16: 60 mg via INTRAVENOUS

## 2022-05-16 MED ORDER — LACTATED RINGERS IV SOLN: INTRAVENOUS | Status: DC

## 2022-05-16 MED ORDER — MIDAZOLAM HCL 5 MG/5ML IJ SOLN
INTRAMUSCULAR | Status: DC | PRN
  Administered 2022-05-16: 2 mg via INTRAVENOUS

## 2022-05-16 MED ORDER — ORAL CARE MOUTH RINSE: 15.0000 mL | Freq: Once | OROMUCOSAL | Status: AC

## 2022-05-16 MED ORDER — EPHEDRINE SULFATE-NACL 50-0.9 MG/10ML-% IV SOSY
PREFILLED_SYRINGE | INTRAVENOUS | Status: DC | PRN
  Administered 2022-05-16: 10 mg via INTRAVENOUS
  Administered 2022-05-16: 5 mg via INTRAVENOUS

## 2022-05-16 MED ORDER — DEXAMETHASONE SODIUM PHOSPHATE 10 MG/ML IJ SOLN
INTRAMUSCULAR | Status: DC | PRN
  Administered 2022-05-16: 10 mg via INTRAVENOUS

## 2022-05-16 NOTE — Anesthesia Postprocedure Evaluation (Signed)
Anesthesia Post Note  Patient: Denise Valentine  Procedure(s) Performed: MRI WITH ANESTHESIA OF LEFT SHOULDER WITHOUT CONTRAST (Left)     Patient location during evaluation: PACU Anesthesia Type: General Level of consciousness: awake and alert Pain management: pain level controlled Vital Signs Assessment: post-procedure vital signs reviewed and stable Respiratory status: spontaneous breathing, nonlabored ventilation, respiratory function stable and patient connected to nasal cannula oxygen Cardiovascular status: blood pressure returned to baseline and stable Postop Assessment: no apparent nausea or vomiting Anesthetic complications: no   No notable events documented.  Last Vitals:  Vitals:   05/16/22 0815 05/16/22 1035  BP: 126/70 128/60  Pulse: 88 63  Resp: 18 14  Temp: 36.7 C 36.7 C  SpO2: 95% 96%    Last Pain:  Vitals:   05/16/22 1035  TempSrc:   PainSc: [name]

## 2022-05-16 NOTE — Anesthesia Procedure Notes (Signed)
Procedure Name: LMA Insertion Date/Time: 05/16/2022 9:42 AM Performed by: Pearson Grippe, CRNA Pre-anesthesia Checklist: Patient identified, Emergency Drugs available, Suction available and Patient being monitored Patient Re-evaluated:Patient Re-evaluated prior to induction Oxygen Delivery Method: Circle system utilized Preoxygenation: Pre-oxygenation with 100% oxygen Induction Type: IV induction Ventilation: Mask ventilation without difficulty LMA: LMA with gastric port inserted LMA Size: 4.0 Number of attempts: 1 Airway Equipment and Method: Bite block Placement Confirmation: positive ETCO2 Tube secured with: Tape Dental Injury: Teeth and Oropharynx as per pre-operative assessment

## 2022-05-16 NOTE — Transfer of Care (Signed)
Immediate Anesthesia Transfer of Care Note  Patient: Denise Valentine  Procedure(s) Performed: MRI WITH ANESTHESIA OF LEFT SHOULDER WITHOUT CONTRAST (Left)  Patient Location: PACU  Anesthesia Type:General  Level of Consciousness: awake, alert  and oriented  Airway & Oxygen Therapy: Patient Spontanous Breathing and Patient connected to nasal cannula oxygen  Post-op Assessment: Report given to RN and Post -op Vital signs reviewed and stable  Post vital signs: Reviewed and stable  Last Vitals:  Vitals Value Taken Time  BP 128/60 05/16/22 1032  Temp    Pulse 64 05/16/22 1034  Resp 15 05/16/22 1034  SpO2 96 % 05/16/22 1034  Vitals shown include unvalidated device data.  Last Pain:  Vitals:   05/16/22 0840  TempSrc:   PainSc: 5       Patients Stated Pain Goal: 4 (05/16/22 0840)  Complications: No notable events documented.

## 2022-05-16 NOTE — Anesthesia Preprocedure Evaluation (Signed)
Anesthesia Evaluation  Patient identified by MRN, date of birth, ID band Patient awake    Reviewed: Allergy & Precautions, NPO status , Patient's Chart, lab work & pertinent test results  Airway Mallampati: II  TM Distance: >3 FB Neck ROM: Full    Dental  (+) Dental Advisory Given   Pulmonary asthma , sleep apnea , COPD, Current Smoker,    breath sounds clear to auscultation       Cardiovascular + CAD   Rhythm:Regular Rate:Normal     Neuro/Psych Seizures -,     GI/Hepatic GERD  ,(+) Hepatitis -, C  Endo/Other  negative endocrine ROS  Renal/GU negative Renal ROS     Musculoskeletal  (+) Arthritis ,   Abdominal   Peds  Hematology negative hematology ROS (+)   Anesthesia Other Findings   Reproductive/Obstetrics                             Anesthesia Physical Anesthesia Plan  ASA: 3  Anesthesia Plan: General   Post-op Pain Management: Minimal or no pain anticipated   Induction: Intravenous  PONV Risk Score and Plan: 2 and Dexamethasone and Ondansetron  Airway Management Planned: LMA and Oral ETT  Additional Equipment:   Intra-op Plan:   Post-operative Plan: Extubation in OR  Informed Consent: I have reviewed the patients History and Physical, chart, labs and discussed the procedure including the risks, benefits and alternatives for the proposed anesthesia with the patient or authorized representative who has indicated his/her understanding and acceptance.     Dental advisory given  Plan Discussed with:   Anesthesia Plan Comments:         Anesthesia Quick Evaluation

## 2022-05-17 ENCOUNTER — Encounter (HOSPITAL_COMMUNITY): Payer: Self-pay | Admitting: Radiology

## 2022-08-16 ENCOUNTER — Emergency Department: Payer: MEDICAID

## 2022-08-16 ENCOUNTER — Other Ambulatory Visit: Payer: Self-pay

## 2022-08-16 ENCOUNTER — Emergency Department
Admission: EM | Admit: 2022-08-16 | Discharge: 2022-08-16 | Discharge status: Against Medical Advice | Payer: MEDICAID | Attending: Emergency Medicine | Admitting: Emergency Medicine

## 2022-08-16 ENCOUNTER — Encounter: Payer: Self-pay | Admitting: *Deleted

## 2022-08-16 DIAGNOSIS — Z5321 Procedure and treatment not carried out due to patient leaving prior to being seen by health care provider: Secondary | ICD-10-CM | POA: Insufficient documentation

## 2022-08-16 DIAGNOSIS — R0789 Other chest pain: Secondary | ICD-10-CM | POA: Insufficient documentation

## 2022-08-16 DIAGNOSIS — R251 Tremor, unspecified: Secondary | ICD-10-CM | POA: Insufficient documentation

## 2022-08-16 DIAGNOSIS — R0689 Other abnormalities of breathing: Secondary | ICD-10-CM | POA: Diagnosis not present

## 2022-08-16 DIAGNOSIS — R000 Tachycardia, unspecified: Secondary | ICD-10-CM | POA: Diagnosis not present

## 2022-08-16 DIAGNOSIS — F419 Anxiety disorder, unspecified: Secondary | ICD-10-CM | POA: Diagnosis present

## 2022-08-16 DIAGNOSIS — R4182 Altered mental status, unspecified: Secondary | ICD-10-CM | POA: Diagnosis not present

## 2022-08-16 DIAGNOSIS — R0902 Hypoxemia: Secondary | ICD-10-CM | POA: Diagnosis not present

## 2022-08-16 LAB — CBC
HCT: 43.1 % (ref 36.0–46.0)
Hemoglobin: 13.9 g/dL (ref 12.0–15.0)
MCH: 27.5 pg (ref 26.0–34.0)
MCHC: 32.3 g/dL (ref 30.0–36.0)
MCV: 85.3 fL (ref 80.0–100.0)
Platelets: 270 K/uL (ref 150–400)
RBC: 5.05 MIL/uL (ref 3.87–5.11)
RDW: 12.9 % (ref 11.5–15.5)
WBC: 8.7 K/uL (ref 4.0–10.5)
nRBC: 0 % (ref 0.0–0.2)

## 2022-08-16 LAB — BASIC METABOLIC PANEL WITH GFR
Anion gap: 8 (ref 5–15)
BUN: 7 mg/dL (ref 6–20)
CO2: 25 mmol/L (ref 22–32)
Calcium: 9 mg/dL (ref 8.9–10.3)
Chloride: 105 mmol/L (ref 98–111)
Creatinine, Ser: 0.74 mg/dL (ref 0.44–1.00)
GFR, Estimated: >60 mL/min
Glucose, Bld: 110 mg/dL — ABNORMAL HIGH (ref 70–99)
Potassium: 4 mmol/L (ref 3.5–5.1)
Sodium: 138 mmol/L (ref 135–145)

## 2022-08-16 LAB — TROPONIN I (HIGH SENSITIVITY): Troponin I (High Sensitivity): 3 ng/L

## 2022-08-16 NOTE — ED Notes (Signed)
First Nurse Note: Pt to ED via [facility], pt took THC gummy and is now having anxiety

## 2022-08-16 NOTE — ED Triage Notes (Signed)
Pt brought in via ems from home.  Pt ate a thc gummie at 1400 today and became anxious, tremulous, and jittery.  Pt reports burning pain in chest.

## 2022-12-14 DIAGNOSIS — Z0000 Encounter for general adult medical examination without abnormal findings: Secondary | ICD-10-CM | POA: Diagnosis not present

## 2022-12-14 DIAGNOSIS — R69 Illness, unspecified: Secondary | ICD-10-CM | POA: Diagnosis not present

## 2022-12-14 DIAGNOSIS — Z7182 Exercise counseling: Secondary | ICD-10-CM | POA: Diagnosis not present

## 2022-12-14 DIAGNOSIS — E782 Mixed hyperlipidemia: Secondary | ICD-10-CM | POA: Diagnosis not present

## 2022-12-14 DIAGNOSIS — Z23 Encounter for immunization: Secondary | ICD-10-CM | POA: Diagnosis not present

## 2022-12-14 DIAGNOSIS — E559 Vitamin D deficiency, unspecified: Secondary | ICD-10-CM | POA: Diagnosis not present

## 2022-12-14 DIAGNOSIS — Z79899 Other long term (current) drug therapy: Secondary | ICD-10-CM | POA: Diagnosis not present

## 2022-12-14 DIAGNOSIS — R911 Solitary pulmonary nodule: Secondary | ICD-10-CM | POA: Diagnosis not present

## 2022-12-14 DIAGNOSIS — Z1331 Encounter for screening for depression: Secondary | ICD-10-CM | POA: Diagnosis not present

## 2024-04-29 NOTE — Progress Notes (Signed)
 Chief Complaint  Patient presents with  . Annual Exam    Subjective:   Denise Valentine is a 53 y.o. female here for a health maintenance visit.  Patient is an established pt Additional concerns addressed today:   HPI History of Present Illness  Diagnoses and all orders for this visit:  Screening for colon cancer She needs and is due for Occult Blood CRC (Colorectal Cancer) Screening, Stool FIT; Future  Hyperlipidemia, mixed She has an ascvd score of 4.5%. she tries to consume a low fat, low cholesterol diet along with exercise. Vitamin D insufficiency Has completed vitamin d 50000IU once weekly course. Needs a recheck on her level     Patient Active Problem List  Diagnosis  . Asthma (HHS-HCC)  . Osteoporosis  . Arthritis  . History of substance abuse (CMS/HHS-HCC)  . Asthma-COPD overlap syndrome (CMS/HHS-HCC)  . 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 Anxiety  . GERD (gastroesophageal reflux disease)  . History of hepatitis C  . Helicobacter pylori antibody positive  . Screening for breast cancer  . Encounter for health maintenance examination  . Obstructive sleep apnea (adult) (pediatric)  . Lung nodule  . Aortic atherosclerosis ()  . Depression, major, recurrent, moderate (CMS/HHS-HCC)  . Bipolar 1 disorder, depressed (CMS/HHS-HCC)  . Vitamin D insufficiency  . Hyperlipidemia, mixed  . Obsessive-compulsive disorder  . PTSD (post-traumatic stress disorder)  . Attention deficit disorder predominant inattentive type  . Acute pain of left wrist  . Occult closed fracture of scaphoid bone of left wrist  . Recurrent major depressive disorder, in full remission ()  . Tobacco use  . DOE (dyspnea on exertion)  . Atypical chest pain  . Claustrophobia  . Closed fracture of distal end of left radius  . Tendinitis of left rotator cuff  . Uses emotional support animal    Outpatient Medications Prior to Visit  Medication Sig Dispense Refill   . albuterol  (PROAIR  HFA) 90 mcg/actuation inhaler Inhale 1 inhalation into the lungs 4 (four) times daily as needed 18 g 11  . albuterol  (PROVENTIL ) 2.5 mg /3 mL (0.083 %) nebulizer solution Take 3 mLs (2.5 mg total) by nebulization every 6 (six) hours as needed for Wheezing 75 mL 12  . budesonide -formoteroL  (SYMBICORT ) 160-4.5 mcg/actuation inhaler Inhale 2 inhalations into the lungs 2 (two) times daily 10.2 g 3  . buPROPion  (WELLBUTRIN  XL) 150 MG XL tablet Take 1 tablet (150 mg total) by mouth once daily 90 tablet 3  . ergocalciferol, vitamin D2, 1,250 mcg (50,000 unit) capsule Take 1 capsule (50,000 Units total) by mouth once a week for 180 days 12 capsule 1  . ibuprofen  (ADVIL ,MOTRIN ) 800 MG tablet Take 1 tablet (800 mg total) by mouth every 8 (eight) hours as needed for moderate pain (4-6)  0  . methadone  HCl (METHADONE  ORAL) Take 60 mg by mouth    . pantoprazole  (PROTONIX ) 40 MG DR tablet Take 1 tablet (40 mg total) by mouth once daily 90 tablet 3  . risperiDONE  (RISPERDAL ) 1 MG tablet Take 1 tablet (1 mg total) by mouth at bedtime 90 tablet 3  . traZODone  (DESYREL ) 50 MG tablet Take 1 tablet (50 mg total) by mouth at bedtime 90 tablet 3   No facility-administered medications prior to visit.    Social Drivers of Health with Concerns   Tobacco Use: High Risk (04/29/2024)   Patient History   . Smoking Tobacco Use: Every Day   . Smokeless Tobacco Use: Never  Housing  Stability: Unknown (01/07/2024)   Housing Stability Vital Sign   . Unable to Pay for Housing in the Last Year: No   . Homeless in the Last Year: No      01/07/2024  [employer] Authorization for Release of Information - [employer]   Are any of your needs urgent? No  Would you like help with any of the needs that you have identified? No    Social History   Tobacco Use  . Smoking status: Every Day    Current packs/day: 0.00    Average packs/day: 0.5 packs/day for 37.0 years (18.5 ttl pk-yrs)    Types: Cigarettes    Start  date: 09/23/1979    Last attempt to quit: 09/22/2016    Years since quitting: 7.6  . Smokeless tobacco: Never  . Tobacco comments:    vape  Vaping Use  . Vaping status: Every Day  Substance Use Topics  . Alcohol use: No    Alcohol/week: 0.0 standard drinks of alcohol  . Drug use: Not Currently    Types: Other-see comments    Comment: history of IV drug use, cocaine and MJ use   Other Health Habits: Exercise: some times/week on average Current exercise activities: walking Diet: well balanced Dental Exam: up to date Eye Exam: up to date  GYN: Sexual Health LMP: Patient's last menstrual period was 10/08/2018. Patient has no period. Menopausal or had hysterectomy Last pap smear: see HM section History of abnormal pap smears: No Sexually active: No with female partner Current contraception: post menopausal status  Health Maintenance: See under health Maintenance activity for review of completion dates as well. Immunization History  Administered Date(s) Administered  . COVID-19 Pfizer Monovalent Vaccine 04/30/2020, 05/24/2020, 08/11/2021  . COVID-19 vaccine >12 years (Pfizer-Biontech, Bivalent) IM Injection 30 mcg/0.3mL 12/14/2021  . Covid-19 Vaccine 98mcg/0.5ml (>=6yrs)Moderna 12/14/2022  . Flu Vaccine IIV3, IM PF (105mo+)(Fluarix, FluLaval, Fluzone ) 10/28/2013, 09/07/2014  . HEP A (>=49YR) VACCINE (HAVRIX/VAQTA) 08/06/2014  . HEP A + HEP B (>=98YR) VACCINE (TWINRIX) 03/04/2015, 06/25/2017  . HEP B (>=562YR) VACCINE (ENGERIX-B/RECOMBIVAX) 08/06/2014, 09/07/2014  . Influenza IIV4, IM PF (6 mo+) (FLULAVAL/FLUZONE /FLUARIX QUAD) 09/03/2017, 12/14/2021  . Influenza IIV4, cell derived (Egg-Free) PF (6 mo+) (Flucelvax QUAD) 09/15/2019  . Influenza TIV (IM) 10/16/2011, 09/05/2015  . Influenza, IM unspecified 08/30/2016, 09/11/2018, 09/15/2019  . PNEUMOCOCCAL (PPSV23)(>=83YRS -OR- >=2 YRS WITH RISK) VACCINE (PNEUMOVAX 23) 08/18/2015  . Pneumococcal (PCV20) (>=6WKS) vaccine (Prevnar 20) (aka  PCV 20) 08/11/2021  . RZV(>=66YR -OR-19+YRS IF  IMMCOMP) VACCINE (SHINGRIX) 12/14/2022  . TDAP (>=62YR) VACCINE (ADACEL/BOOSTRIX) 10/28/2013, 07/25/2019    Health Maintenance Topics with due status: Overdue     Topic Date Due   Shingrix 02/08/2023   COVID-19 Vaccine 08/19/2023   Lipid Panel 12/15/2023   Mammogram 02/08/2024   Colorectal Cancer Screening 04/23/2024   Health Maintenance Topics with due status: Not Due     Topic Last Completion Date   Adult Tetanus (Td And Tdap) 07/25/2019   Pap Smear 05/26/2020   Influenza Vaccine 12/14/2021   Depression Screening 04/29/2024   Annual Physical/Well Child Check 04/29/2024   Health Maintenance Topics with due status: Completed     Topic Last Completion Date   Hepatitis A Vaccines 06/25/2017   Pneumococcal Vaccine: 50+ 08/11/2021   HIV Screen 05/01/2022   Hepatitis C Screen 05/01/2022   Health Maintenance Topics with due status: Aged Out     Topic Date Due   Hib Vaccines Aged Out   Meningococcal B Vaccine Aged Out  Meningococcal ACWY Vaccine Aged Out   HPV Vaccines Aged Out    Depression Screen-PHQ2/9 PHQ-2 PHQ-2 Over the last 2 weeks, how often have you been bothered by any of the following problems? Little interest or pleasure in doing things: (Patient-Rptd) Not at all Feeling down, depressed, or hopeless: (Patient-Rptd) Not at all Patient Health Questionnaire-2 Score: (Patient-Rptd) 0  PHQ-9 (if PHQ >=3) PHQ-9 Over the last 2 weeks, how often have you been bothered by any of the following problems? Trouble falling or staying asleep, or sleeping too much: (Patient-Rptd) Not at all Feeling tired or having little energy: (Patient-Rptd) Not at all Poor appetite or overeating: (Patient-Rptd) Not at all Feeling bad about yourself - or that you are a failure or have let yourself or your family down: (Patient-Rptd) Not at all Trouble concentrating on things, such as reading the newspaper or watching television:  (Patient-Rptd) Not at all Moving or speaking so slowly that other people could have noticed? Or the opposite - being so fidgety or restless that you have been moving around a lot more than usual.: (Patient-Rptd) Not at all Thoughts that you would be better off dead or hurting yourself in some way: (Patient-Rptd) Not at all Patient Health Questionnaire-9 Score: (Patient-Rptd) 0  PHQ-2 Interpretation Values between 0-3 are considered not significant for depression  PHQ-9 Interpretation and Treatment Recommendations:  0-4= None  5-9= Mild / Treatment: Support, educate to call if worse; return in one month  10-14= Moderate / Treatment: Support, watchful waiting; Antidepressant or Psychotherapy  15-19= Moderately severe / Treatment: Antidepressant OR Psychotherapy  >= 20 = Major depression, severe / Antidepressant AND Psychotherapy  ROS   Objective:   Vitals:   04/29/24 1420 04/29/24 1453  BP: (!) 88/58 110/78  Pulse: 76   Weight: 81 kg (178 lb 9.6 oz)   Height: 167.6 cm (5' 6)   PainSc: 0-No pain    Body mass index is 28.83 kg/m. Home vitals:     Physical Exam  Physical Exam  Constitutional: alert and in NAD Conversation: normal and appropriate HEENT: EOMI, external ear canals normal, tympanic membranes normal, and pupils equal and round dry mucus membranes Skin: normal Oropharynx: mucous membranes moist Neck: supple and no thyroid enlargement or cervical adenopathy Respiratory: clear to auscultation, without rales or wheezes Cardiovascular: regular rate and rhythm and without murmurs, rubs or gallops Abdomen: soft, nontender, nondistended, and no hepatosplenomegaly Musculoskeletal: age appropriate ROM of shoulders, hips and knees Extremities: no lower extremity edema, dorsalis pedis pulses normal Neurological: grossly intact and normal gait  PE Chaperone note:  Breast Exam: Breast exam was normal; normal appearance, no masses or significant tenderness Pelvic Exam:  Pelvic exam was deferred  Results   Assessment/Plan:  Diagnoses and all orders for this visit:  Routine general medical examination at health care facility Advised to keep well hydrated which can improve blood pressure readings as she was provided with water during her visit, and her bp improved with hydration Patient was seen for a health maintenance exam.  Counseled the patient on health maintenance issues. Reviewed her health maintenance schedule and ordered appropriate tests. Counseled on regular exercise and weight management. Recommend regular eye exams and dental cleaning.   The following issues were addressed today for health maintenance: Colon cancer screening options reviewed Diet - reviewed healthy diet and weight management Diet - discussed weight loss strategies Dermatologist skin exam recommended Encouraged to increase or continue intake of vitamin D (goal 1000-2000 Int Units/day) Encouraged to schedule routine dental  follow up Encouraged to schedule routine eye check Follow up for annual exam in one year Recommended wearing seat belts Recommended sunscreen usage Women's Health - completed breast exam  The patient's BMI is above the acceptable range; discussed or provided materials on diet/exercise  Assessment & Plan   Depression screening (Z13.31) -     Depression Screen -(PHQ- 2/9, BDI) Rtc, prn Hyperlipidemia, mixed -     Lipid Panel W/Reflex Direct Low Density Lipoprotein (LDL) Cholesterol; Future -     Comprehensive Metabolic Panel (CMP); Future -     Complete Blood Count (CBC); Future - Continue current medicartions -  Reviewed goal for LDL of less than 130 -  Encouraged to follow a low fat, low cholesterol diet -  Discussed the benefits of regular aerobic exercise -  Encouraged weight loss  Vitamin D insufficiency -     25 [place] Vitamin D; Future Rtc, prn Screening for colon cancer -     Occult Blood CRC (Colorectal Cancer) Screening, Stool FIT;  Future Rtc, prn     This visit was coded based on medical decision making (MDM).           Future Appointments     Date/Time Provider Department Center Visit Type   04/30/2025 1:00 PM (Arrive by 12:45 PM) Zachary Idelia Sherita Jock, MD [facility] PHYSICAL       An after visit summary was provided for the patient either in written format or through MyChart.  This note has been created using automated tools and reviewed for accuracy by [facility] [place]. This visit was coded based on medical decision making (MDM).    Portions of this note were generated with voice recognition software (DragonMedicalOne dictation software / speech recognition software/[email or web address]) and may contain unintended transcription errors as interpreted by the software. I apologize for any  typographical errors that were not detected and corrected.

## 2024-05-09 ENCOUNTER — Other Ambulatory Visit: Payer: Self-pay

## 2024-05-09 ENCOUNTER — Emergency Department
Admission: EM | Admit: 2024-05-09 | Discharge: 2024-05-09 | Disposition: A | Discharge status: Home / Self Care | Payer: MEDICAID | Attending: Emergency Medicine | Admitting: Emergency Medicine

## 2024-05-09 ENCOUNTER — Emergency Department: Payer: MEDICAID

## 2024-05-09 DIAGNOSIS — J441 Chronic obstructive pulmonary disease with (acute) exacerbation: Secondary | ICD-10-CM | POA: Diagnosis not present

## 2024-05-09 DIAGNOSIS — I2510 Atherosclerotic heart disease of native coronary artery without angina pectoris: Secondary | ICD-10-CM | POA: Insufficient documentation

## 2024-05-09 DIAGNOSIS — R0602 Shortness of breath: Secondary | ICD-10-CM | POA: Diagnosis present

## 2024-05-09 LAB — BASIC METABOLIC PANEL WITH GFR
Anion gap: 10 (ref 5–15)
BUN: 8 mg/dL (ref 6–20)
CO2: 29 mmol/L (ref 22–32)
Calcium: 8.6 mg/dL — ABNORMAL LOW (ref 8.9–10.3)
Chloride: 100 mmol/L (ref 98–111)
Creatinine, Ser: 0.67 mg/dL (ref 0.44–1.00)
GFR, Estimated: >60 mL/min (ref >60)
Glucose, Bld: 103 mg/dL — ABNORMAL HIGH (ref 70–99)
Potassium: 4.4 mmol/L (ref 3.5–5.1)
Sodium: 139 mmol/L (ref 135–145)

## 2024-05-09 LAB — CBC
HCT: 39.1 % (ref 36.0–46.0)
Hemoglobin: 12.3 g/dL (ref 12.0–15.0)
MCH: 27.8 pg (ref 26.0–34.0)
MCHC: 31.5 g/dL (ref 30.0–36.0)
MCV: 88.3 fL (ref 80.0–100.0)
Platelets: 243 10*3/uL (ref 150–400)
RBC: 4.43 MIL/uL (ref 3.87–5.11)
RDW: 14 % (ref 11.5–15.5)
WBC: 6.5 10*3/uL (ref 4.0–10.5)
nRBC: 0 % (ref 0.0–0.2)

## 2024-05-09 LAB — TROPONIN I (HIGH SENSITIVITY)
Troponin I (High Sensitivity): <2 ng/L (ref <18)
Troponin I (High Sensitivity): 3 ng/L (ref <18)

## 2024-05-09 LAB — D-DIMER, QUANTITATIVE: D-Dimer, Quant: 0.34 ug{FEU}/mL (ref 0.00–0.50)

## 2024-05-09 MED ORDER — METHYLPREDNISOLONE SODIUM SUCC 125 MG IJ SOLR
125.0000 mg | Freq: Once | INTRAMUSCULAR | Status: AC
  Administered 2024-05-09: 125 mg via INTRAVENOUS
  Filled 2024-05-09: qty 2

## 2024-05-09 MED ORDER — METHYLPREDNISOLONE 4 MG PO TBPK: ORAL_TABLET | ORAL | 0 refills | Status: DC

## 2024-05-09 MED ORDER — ALBUTEROL SULFATE (2.5 MG/3ML) 0.083% IN NEBU: 3.0000 mL | INHALATION_SOLUTION | Freq: Four times a day (QID) | RESPIRATORY_TRACT | 1 refills | Status: AC | PRN

## 2024-05-09 MED ORDER — IPRATROPIUM-ALBUTEROL 0.5-2.5 (3) MG/3ML IN SOLN
3.0000 mL | Freq: Once | RESPIRATORY_TRACT | Status: AC
  Administered 2024-05-09: 3 mL via RESPIRATORY_TRACT
  Filled 2024-05-09: qty 3

## 2024-05-09 NOTE — ED Provider Notes (Signed)
 [facility] Provider Note    Event Date/Time   First MD Initiated Contact with Patient 05/09/24 1724     (approximate)   History   Shortness of Breath   HPI  Denise Valentine is a 53 y.o. female with PMH of seizures, bipolar disorder, substance abuse, COPD, asthma and CAD who presents for evaluation of shortness of breath for about 3 days.  Patient states that her symptoms have slowly been getting worse over the past couple days.  She says that she has dyspnea with exertion.  She feels short of breath when walking from her kitchen to her living room.  She also endorses some pleuritic chest pain.  Patient takes Symbicort  twice a day which is normally able to control her symptoms however she has needed to use her albuterol  inhaler a few times a day for the past few days with minimal relief from this.      Physical Exam   Triage Vital Signs: ED Triage Vitals [05/09/24 1610]  Encounter Vitals Group     BP 127/61     Systolic BP Percentile      Diastolic BP Percentile      Pulse Rate 71     Resp 20     Temp 98.1 F (36.7 C)     Temp Source Oral     SpO2 96 %     Weight 177 lb (80.3 kg)     Height 5\' 7"  (1.702 m)     Head Circumference      Peak Flow      Pain Score 0     Pain Loc      Pain Education      Exclude from Growth Chart     Most recent vital signs: Vitals:   05/09/24 1610  BP: 127/61  Pulse: 71  Resp: 20  Temp: 98.1 F (36.7 C)  SpO2: 96%   General: Awake, no distress.  CV:  Good peripheral perfusion.  RRR. Resp:  Normal effort.  CTAB with prolonged expiratory phase.  Speaking in full sentences. Abd:  No distention.    ED Results / Procedures / Treatments   Labs (all labs ordered are listed, but only abnormal results are displayed) Labs Reviewed  BASIC METABOLIC PANEL WITH GFR - Abnormal; Notable for the following components:      Result Value   Glucose, Bld 103 (*)    Calcium 8.6 (*)    All other components within normal  limits  CBC  D-DIMER, QUANTITATIVE  TROPONIN I (HIGH SENSITIVITY)  TROPONIN I (HIGH SENSITIVITY)     EKG  ED provider interpretation: Normal sinus rhythm with no ST changes.  Vent. rate 75 BPM PR interval 146 ms QRS duration 80 ms QT/QTcB 388/433 ms P-R-T axes 75 76 53   RADIOLOGY  Chest x-ray obtained, I interpreted the images as well as reviewed the radiologist report which showed mild peribronchial thickening.  PROCEDURES:  Critical Care performed: No  Procedures   MEDICATIONS ORDERED IN ED: Medications  methylPREDNISolone  sodium succinate (SOLU-MEDROL ) 125 mg/2 mL injection 125 mg (125 mg Intravenous Given 05/09/24 1930)  ipratropium-albuterol  (DUONEB) 0.5-2.5 (3) MG/3ML nebulizer solution 3 mL (3 mLs Nebulization Given 05/09/24 1933)  ipratropium-albuterol  (DUONEB) 0.5-2.5 (3) MG/3ML nebulizer solution 3 mL (3 mLs Nebulization Given 05/09/24 1933)     IMPRESSION / MDM / ASSESSMENT AND PLAN / ED COURSE  I reviewed the triage vital signs and the nursing notes.  53 year old female presents for evaluation of shortness of breath.  Vital signs are stable and patient is NAD on exam.  Differential diagnosis includes, but is not limited to, COPD exacerbation, pulmonary embolism, ACS, pneumonia, pneumothorax, URI, anemia.  Patient's presentation is most consistent with acute presentation with potential threat to life or bodily function.  CBC is unremarkable and BMP shows mild hypocalcemia.   Chest x-ray shows mild peribronchial thickening consistent with COPD exacerbation.  No evidence of pneumonia or pneumothorax.  EKG shows normal sinus rhythm without ST changes.  Troponin is not elevated x 2.  Can rule out ACS as a cause.  Based on patient's symptom presentation I am most suspicious of COPD exacerbation however, since patient is over 50 I cannot use the PERC criteria to rule out a PE.  She is low risk based on Wells criteria so I will obtain  a D-dimer to rule out PE.  If elevated will proceed with ultrasound and CT chest.  Patient is in agreement with plan.  D-dimer negative so I can rule out PE and DVT.  She was given Solu-Medrol  and 2 breathing treatments in the emergency department to address her shortness of breath.  Patient reported significant improvement in her symptoms.  Will refill patient's nebulizer solution and give her a short course of oral steroids.  She has follow-up scheduled with her primary care provider.  We discussed return precautions.  She voiced understanding, all questions were answered and she is stable at discharge.     FINAL CLINICAL IMPRESSION(S) / ED DIAGNOSES   Final diagnoses:  COPD exacerbation (HCC)     Rx / DC Orders   ED Discharge Orders          Ordered    methylPREDNISolone  (MEDROL  DOSEPAK) 4 MG TBPK tablet        05/09/24 2034    albuterol  (PROVENTIL ) (2.5 MG/3ML) 0.083% nebulizer solution  Every 6 hours PRN        05/09/24 2034             Note:  This document was prepared using Dragon voice recognition software and may include unintentional dictation errors.   Phyliss Breen, PA-C 05/09/24 2041    Lubertha Rush, MD 05/10/24 1054

## 2024-05-09 NOTE — Discharge Instructions (Addendum)
 Your blood work was normal today. The chest x-ray showed peribronchial thickening which is consistent with COPD.  The EKG was normal.  I believe your shortness of breath is caused by worsening COPD symptoms.  Please take the steroids as prescribed. Use the nebulizer as needed.  Return to the ED with any worsening symptoms.

## 2024-05-09 NOTE — ED Triage Notes (Signed)
 Pt comes in via pov with complaints of SHOB for about 3 days. Pt has sob on exertion, and had to take 3 puffs of her inhaler today with short term relief. Pt has a history of asthma, COPD, emphysema. Pt has no complaints of pain at this time. Pt is alert and oriented x4 with no signs of acute distress at this time.

## 2024-08-05 NOTE — Progress Notes (Signed)
 Chief Complaint  Patient presents with  . Skin Lesion  . Mental Health Problem    Feels like she can't remember anything. Pt currently in school for addictions counselor     Subjective  Denise Valentine is a 53 y.o. female who presents for Skin Lesion and Mental Health Problem (Feels like she can't remember anything. Pt currently in school for addictions counselor ) HPI History of Present Illness Denise Valentine is a 53 year old female who presents with attention issues and dermatological concerns.  She has been experiencing worsening attention issues since turning [age], including incidents such as leaving water running and forgetting about it, as well as leaving her car running. She has a history of being on Ritalin  for attention issues but stopped taking it during a six-month break from her studies following her husband's passing. She is now approaching graduation and is concerned about her ability to focus and perform well academically.  She also reports dermatological concerns, specifically mentioning spots that have appeared on her nose and other areas, as well as flaking around her eyes. She expresses a need to see a dermatologist for these issues.  She is receiving therapy and peer support through [facility] and her housing, which includes home visits.   GAD 7 result:     08/05/2024  GAD-7 w/ Score  Feeling nervous, anxious, or on edge More than half the days  Not being able to stop or control worrying More than half the days  Worrying too much about different things More than half the days  Trouble relaxing More than half the days  Being so restless that it is hard to sit still More than half the days  Becoming easily annoyed or irritable More than half the days  Feeling afraid as if something awful might happen More than half the days  GAD-7 Total Score 14 *  --   --     * Patient-reported     PHQ-9 from today's flowsheet  Patient Health Questionnaire (PHQ-9) Little interest or  pleasure in doing things: (Patient-Rptd) Several days Feeling down, depressed, or hopeless: (Patient-Rptd) Several days Patient Health Questionnaire-2 Score: (Patient-Rptd) 2 Trouble falling or staying asleep, or sleeping too much: (Patient-Rptd) Several days Feeling tired or having little energy: (Patient-Rptd) More than half the days Poor appetite or overeating: (Patient-Rptd) More than half the days Feeling bad about yourself - or that you are a failure or have let yourself or your family down: (Patient-Rptd) More than half the days Trouble concentrating on things, such as reading the newspaper or watching television: (Patient-Rptd) Nearly every day Moving or speaking so slowly that other people could have noticed? Or the opposite - being so fidgety or restless that you have been moving around a lot more than usual.: (Patient-Rptd) More than half the days Thoughts that you would be better off dead or hurting yourself in some way: (Patient-Rptd) Not at all How difficult have these problems made it for you to do your work, take care of things at home, or get along with other people?: (Patient-Rptd) Somewhat difficult Patient Health Questionnaire-9 Score: (Patient-Rptd) 14  Depression Severity and Treatment Recommendations:  0-4= None  5-9= Mild / Treatment: Support, educate to call if worse; return in one month  10-14= Moderate / Treatment: Support, watchful waiting; Antidepressant or Psychotherapy  15-19= Moderately severe / Treatment: Antidepressant OR Psychotherapy  >= 20 = Major depression, severe / Antidepressant AND Psychotherapy  Review of Systems See hpi  Patient Active Problem List  Diagnosis  .  Asthma (HHS-HCC)  . Osteoporosis  . Arthritis  . History of substance abuse (CMS/HHS-HCC)  . Asthma-COPD overlap syndrome (CMS/HHS-HCC)  . 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 Anxiety  . GERD (gastroesophageal reflux disease)  . History of hepatitis C   . Helicobacter pylori antibody positive  . Screening for breast cancer  . Encounter for health maintenance examination  . Obstructive sleep apnea (adult) (pediatric)  . Lung nodule  . Aortic atherosclerosis ()  . Depression, major, recurrent, moderate (CMS/HHS-HCC)  . Bipolar 1 disorder, depressed (CMS/HHS-HCC)  . Vitamin D insufficiency  . Hyperlipidemia, mixed  . Obsessive-compulsive disorder  . PTSD (post-traumatic stress disorder)  . Attention deficit disorder predominant inattentive type  . Acute pain of left wrist  . Occult closed fracture of scaphoid bone of left wrist  . Recurrent major depressive disorder, in full remission ()  . Tobacco use  . DOE (dyspnea on exertion)  . Atypical chest pain  . Claustrophobia  . Closed fracture of distal end of left radius  . Tendinitis of left rotator cuff  . Uses emotional support animal    Outpatient Medications Prior to Visit  Medication Sig Dispense Refill  . albuterol  MDI, PROVENTIL , VENTOLIN , PROAIR , HFA (PROAIR  HFA) 90 mcg/actuation inhaler Inhale 1 Puff into the lungs 4 (four) times daily as needed 18 g 11  . budesonide -formoteroL  (SYMBICORT ) 160-4.5 mcg/actuation inhaler Inhale 2 inhalations into the lungs 2 (two) times daily 10.2 g 12  . buPROPion  (WELLBUTRIN  XL) 150 MG XL tablet Take 1 tablet (150 mg total) by mouth once daily 90 tablet 3  . ibuprofen  (ADVIL ,MOTRIN ) 800 MG tablet Take 1 tablet (800 mg total) by mouth every 8 (eight) hours as needed for moderate pain (4-6)  0  . methadone  HCl (METHADONE  ORAL) Take 60 mg by mouth    . pantoprazole  (PROTONIX ) 40 MG DR tablet Take 1 tablet (40 mg total) by mouth once daily 90 tablet 3  . risperiDONE  (RISPERDAL ) 1 MG tablet Take 1 tablet (1 mg total) by mouth at bedtime 90 tablet 3  . tiotropium bromide  (SPIRIVA  RESPIMAT) 2.5 mcg/actuation inhalation spray Inhale 2 inhalations (5 mcg total) into the lungs once daily 4 g 11  . traZODone  (DESYREL ) 50 MG tablet Take 1 tablet (50 mg  total) by mouth at bedtime 90 tablet 3  . albuterol  (PROVENTIL ) 2.5 mg /3 mL (0.083 %) nebulizer solution Take 3 mLs (2.5 mg total) by nebulization every 6 (six) hours as needed for Wheezing 75 mL 12  . ergocalciferol, vitamin D2, 1,250 mcg (50,000 unit) capsule Take 1 capsule (50,000 Units total) by mouth once a week for 180 days 12 capsule 1  . LORazepam  (ATIVAN ) 1 MG tablet Use 1 tab 1 hour before CT and another tab if needed during CT (Patient not taking: Reported on 08/05/2024) 2 tablet 0   No facility-administered medications prior to visit.      Objective  Vitals:   08/05/24 1437  BP: 99/65  Pulse: 70  Weight: 86.1 kg (189 lb 12.8 oz)  PainSc: 0-No pain   Body mass index is 30.65 kg/m.  Home Vitals:     Physical Exam Physical Exam    Constitutional: alert, in NAD, and communicates well Pleasant affect. Anxious. Respiratory: clear to auscultation, without rales or wheezes  Cardiovascular: regular rate and rhythm and without murmurs, rubs or gallops Lower extremities: no lower extremity edema Skin: erythematous scaling lesion on right nasal bridge Results  Assessment/Plan:   Assessment & Plan Attention-deficit hyperactivity disorder (ADHD) ADHD with worsening attention issues impacting daily activities and academic performance. No current psychiatrist for medication management. - Start Ritalin  for ADHD management. 20 mg BID (this is less than she was previously taking) - Refer to psychiatrist for medication management- she is also on risperidone , wellbutrin , trazodone   Skin lesions of the nose and face Skin lesions on the nose and face with flaking around the eyes. She requested dermatological evaluation. - Refer to dermatologist for evaluation of skin lesions. Diagnoses and all orders for this visit:  Bipolar 1 disorder, depressed (CMS/HHS-HCC) -     Depression Screen -(PHQ- 2/9, BDI) -     Ambulatory Referral To Primary Care Behavioral Health [facility]  Only - 77yrs and older)  Depression, major, recurrent, moderate (CMS/HHS-HCC) -     Depression Screen -(PHQ- 2/9, BDI) -     Anxiety Screen [NUR6106] -     Ambulatory Referral To Primary Care Behavioral [facility] Only - 63yrs and older)  Attention deficit disorder predominant inattentive type -     Ambulatory Referral To Primary Care [facility]al [facility] Only - 69yrs and older)  Non-healing skin lesion of nose -     Ambulatory Referral to Dermatology  Hyperlipidemia, mixed -     Lipid Panel W/Reflex Direct Low Density Lipoprotein (LDL) Cholesterol -     Comprehensive Metabolic Panel (CMP) -     Complete Blood Count (CBC)  Vitamin D insufficiency -     25 OH Vitamin D  Other orders -     methylphenidate  HCl (RITALIN ) 20 MG tablet; Take 1 tablet (20 mg total) by mouth 2 (two) times daily for 30 days -     methylphenidate  HCl (RITALIN ) 20 MG tablet; Take 1 tablet (20 mg total) by mouth 2 (two) times daily for 30 days -     methylphenidate  HCl (RITALIN ) 20 MG tablet; Take 1 tablet (20 mg total) by mouth 2 (two) times daily for 30 days  She is going to get labs from her physical as well.    This visit was coded based on medical decision making (MDM).    Future Appointments     Date/Time Provider Department Center Visit Type   08/05/2024 3:30 PM (Arrive by 3:15 PM) Trinidad Cassondra Fell, MD [facility] [place] OFFICE VISIT   09/08/2024 9:20 AM (Arrive by 9:05 AM) [facility] SPIROMETRY/DLCO/LUNG VOLUMES   09/08/2024 10:00 AM (Arrive by 9:45 AM) [facility] SIX MINUTE WALK   09/08/2024 11:00 AM (Arrive by 10:45 AM) Florian Kreg Conger, NP [facility] RETURN VISIT   10/17/2024 7:30 AM SCHEDULE A PRE-OP SCREENING [facility] PASS APP PHONE CALL   10/31/2024 12:00 PM (Arrive by 10:30 AM) DMP MR 2 [facility] [place] [facility] DMP MRI SHOULDER RIGHT WO ANES   04/30/2025 1:00 PM (Arrive by 12:45 PM) Zachary Idelia Sherita Jock, MD [facility] [place] [facility] PHYSICAL       There are no Patient Instructions on file for this visit.  An after visit summary was provided for the patient either in written format (printed) or through [facility].  This note has been created using automated tools and reviewed for accuracy by JONATHON COOPER HODGES.  *Some images could not be shown.

## 2024-08-31 ENCOUNTER — Emergency Department: Payer: MEDICAID

## 2024-08-31 ENCOUNTER — Inpatient Hospital Stay
Admission: EM | Admit: 2024-08-31 | Discharge: 2024-09-05 | DRG: 191 | Disposition: A | Discharge status: Home / Self Care | Payer: MEDICAID | Attending: Internal Medicine | Admitting: Internal Medicine

## 2024-08-31 ENCOUNTER — Other Ambulatory Visit: Payer: Self-pay

## 2024-08-31 ENCOUNTER — Inpatient Hospital Stay: Payer: MEDICAID

## 2024-08-31 DIAGNOSIS — Z79899 Other long term (current) drug therapy: Secondary | ICD-10-CM

## 2024-08-31 DIAGNOSIS — J9601 Acute respiratory failure with hypoxia: Secondary | ICD-10-CM | POA: Diagnosis present

## 2024-08-31 DIAGNOSIS — Z881 Allergy status to other antibiotic agents status: Secondary | ICD-10-CM

## 2024-08-31 DIAGNOSIS — Z23 Encounter for immunization: Secondary | ICD-10-CM | POA: Diagnosis not present

## 2024-08-31 DIAGNOSIS — Z720 Tobacco use: Secondary | ICD-10-CM | POA: Insufficient documentation

## 2024-08-31 DIAGNOSIS — Z888 Allergy status to other drugs, medicaments and biological substances status: Secondary | ICD-10-CM | POA: Diagnosis not present

## 2024-08-31 DIAGNOSIS — R9431 Abnormal electrocardiogram [ECG] [EKG]: Secondary | ICD-10-CM | POA: Diagnosis not present

## 2024-08-31 DIAGNOSIS — Z1152 Encounter for screening for COVID-19: Secondary | ICD-10-CM

## 2024-08-31 DIAGNOSIS — J441 Chronic obstructive pulmonary disease with (acute) exacerbation: Secondary | ICD-10-CM | POA: Diagnosis present

## 2024-08-31 DIAGNOSIS — E785 Hyperlipidemia, unspecified: Secondary | ICD-10-CM | POA: Diagnosis present

## 2024-08-31 DIAGNOSIS — Z91013 Allergy to seafood: Secondary | ICD-10-CM | POA: Diagnosis not present

## 2024-08-31 DIAGNOSIS — F1120 Opioid dependence, uncomplicated: Secondary | ICD-10-CM | POA: Diagnosis present

## 2024-08-31 DIAGNOSIS — F419 Anxiety disorder, unspecified: Secondary | ICD-10-CM | POA: Diagnosis present

## 2024-08-31 DIAGNOSIS — R918 Other nonspecific abnormal finding of lung field: Secondary | ICD-10-CM | POA: Diagnosis present

## 2024-08-31 DIAGNOSIS — F909 Attention-deficit hyperactivity disorder, unspecified type: Secondary | ICD-10-CM | POA: Diagnosis present

## 2024-08-31 DIAGNOSIS — R7989 Other specified abnormal findings of blood chemistry: Secondary | ICD-10-CM | POA: Insufficient documentation

## 2024-08-31 DIAGNOSIS — Z8249 Family history of ischemic heart disease and other diseases of the circulatory system: Secondary | ICD-10-CM

## 2024-08-31 DIAGNOSIS — I2510 Atherosclerotic heart disease of native coronary artery without angina pectoris: Secondary | ICD-10-CM | POA: Diagnosis present

## 2024-08-31 DIAGNOSIS — Z86718 Personal history of other venous thrombosis and embolism: Secondary | ICD-10-CM | POA: Diagnosis not present

## 2024-08-31 DIAGNOSIS — F17210 Nicotine dependence, cigarettes, uncomplicated: Secondary | ICD-10-CM | POA: Diagnosis present

## 2024-08-31 DIAGNOSIS — R9389 Abnormal findings on diagnostic imaging of other specified body structures: Secondary | ICD-10-CM

## 2024-08-31 DIAGNOSIS — F319 Bipolar disorder, unspecified: Secondary | ICD-10-CM | POA: Diagnosis present

## 2024-08-31 DIAGNOSIS — Z7951 Long term (current) use of inhaled steroids: Secondary | ICD-10-CM

## 2024-08-31 DIAGNOSIS — K219 Gastro-esophageal reflux disease without esophagitis: Secondary | ICD-10-CM | POA: Diagnosis present

## 2024-08-31 DIAGNOSIS — Z9049 Acquired absence of other specified parts of digestive tract: Secondary | ICD-10-CM | POA: Diagnosis not present

## 2024-08-31 LAB — TROPONIN I (HIGH SENSITIVITY)
Troponin I (High Sensitivity): <2 ng/L (ref <18)
Troponin I (High Sensitivity): <2 ng/L (ref <18)

## 2024-08-31 LAB — BASIC METABOLIC PANEL WITH GFR
Anion gap: 15 (ref 5–15)
BUN: 7 mg/dL (ref 6–20)
CO2: 26 mmol/L (ref 22–32)
Calcium: 8.9 mg/dL (ref 8.9–10.3)
Chloride: 99 mmol/L (ref 98–111)
Creatinine, Ser: 0.74 mg/dL (ref 0.44–1.00)
GFR, Estimated: >60 mL/min (ref >60)
Glucose, Bld: 126 mg/dL — ABNORMAL HIGH (ref 70–99)
Potassium: 4.4 mmol/L (ref 3.5–5.1)
Sodium: 140 mmol/L (ref 135–145)

## 2024-08-31 LAB — CBC
HCT: 45.2 % (ref 36.0–46.0)
Hemoglobin: 14.2 g/dL (ref 12.0–15.0)
MCH: 27.6 pg (ref 26.0–34.0)
MCHC: 31.4 g/dL (ref 30.0–36.0)
MCV: 87.9 fL (ref 80.0–100.0)
Platelets: 218 K/uL (ref 150–400)
RBC: 5.14 MIL/uL — ABNORMAL HIGH (ref 3.87–5.11)
RDW: 13 % (ref 11.5–15.5)
WBC: 5.2 K/uL (ref 4.0–10.5)
nRBC: 0 % (ref 0.0–0.2)

## 2024-08-31 LAB — RESP PANEL BY RT-PCR (RSV, FLU A&B, COVID)  RVPGX2
Influenza A by PCR: NEGATIVE
Influenza B by PCR: NEGATIVE
Resp Syncytial Virus by PCR: NEGATIVE
SARS Coronavirus 2 by RT PCR: NEGATIVE

## 2024-08-31 LAB — BLOOD GAS, VENOUS
Acid-Base Excess: 5.7 mmol/L — ABNORMAL HIGH (ref 0.0–2.0)
Bicarbonate: 33.7 mmol/L — ABNORMAL HIGH (ref 20.0–28.0)
O2 Saturation: 51.4 %
Patient temperature: 37
pCO2, Ven: 64 mmHg — ABNORMAL HIGH (ref 44–60)
pH, Ven: 7.33 (ref 7.25–7.43)
pO2, Ven: 32 mmHg (ref 32–45)

## 2024-08-31 LAB — BRAIN NATRIURETIC PEPTIDE: B Natriuretic Peptide: 26.2 pg/mL (ref 0.0–100.0)

## 2024-08-31 LAB — D-DIMER, QUANTITATIVE: D-Dimer, Quant: 0.69 ug{FEU}/mL — ABNORMAL HIGH (ref 0.00–0.50)

## 2024-08-31 MED ORDER — METHYLPREDNISOLONE SODIUM SUCC 125 MG IJ SOLR
125.0000 mg | Freq: Once | INTRAMUSCULAR | Status: AC
  Administered 2024-08-31: 125 mg via INTRAVENOUS
  Filled 2024-08-31: qty 2

## 2024-08-31 MED ORDER — ENOXAPARIN SODIUM 40 MG/0.4ML IJ SOSY
40.0000 mg | PREFILLED_SYRINGE | INTRAMUSCULAR | Status: DC
  Administered 2024-08-31 – 2024-09-04 (×5): 40 mg via SUBCUTANEOUS
  Filled 2024-08-31 (×5): qty 0.4

## 2024-08-31 MED ORDER — PANTOPRAZOLE SODIUM 40 MG PO TBEC
40.0000 mg | DELAYED_RELEASE_TABLET | Freq: Every day | ORAL | Status: DC
  Administered 2024-08-31 – 2024-09-04 (×5): 40 mg via ORAL
  Filled 2024-08-31 (×5): qty 1

## 2024-08-31 MED ORDER — SODIUM CHLORIDE 0.9 % IV SOLN
1.0000 g | INTRAVENOUS | Status: AC
  Administered 2024-08-31 – 2024-09-04 (×5): 1 g via INTRAVENOUS
  Filled 2024-08-31 (×5): qty 10

## 2024-08-31 MED ORDER — SODIUM CHLORIDE 0.9 % IV BOLUS
500.0000 mL | Freq: Once | INTRAVENOUS | Status: AC
  Administered 2024-08-31: 500 mL via INTRAVENOUS

## 2024-08-31 MED ORDER — IOHEXOL 350 MG/ML SOLN
75.0000 mL | Freq: Once | INTRAVENOUS | Status: AC | PRN
  Administered 2024-08-31: 75 mL via INTRAVENOUS

## 2024-08-31 MED ORDER — IPRATROPIUM-ALBUTEROL 0.5-2.5 (3) MG/3ML IN SOLN
3.0000 mL | Freq: Once | RESPIRATORY_TRACT | Status: AC
  Administered 2024-08-31: 3 mL via RESPIRATORY_TRACT
  Filled 2024-08-31: qty 3

## 2024-08-31 MED ORDER — PREDNISONE 20 MG PO TABS
40.0000 mg | ORAL_TABLET | Freq: Every day | ORAL | Status: DC
  Administered 2024-09-01 – 2024-09-02 (×2): 40 mg via ORAL
  Filled 2024-08-31 (×2): qty 2

## 2024-08-31 MED ORDER — RISPERIDONE 0.5 MG PO TABS
1.0000 mg | ORAL_TABLET | Freq: Every day | ORAL | Status: DC
  Administered 2024-08-31 – 2024-09-04 (×5): 1 mg via ORAL
  Filled 2024-08-31 (×5): qty 2

## 2024-08-31 MED ORDER — BUPROPION HCL ER (XL) 150 MG PO TB24
150.0000 mg | ORAL_TABLET | Freq: Every day | ORAL | Status: DC
  Administered 2024-08-31 – 2024-09-04 (×5): 150 mg via ORAL
  Filled 2024-08-31 (×5): qty 1

## 2024-08-31 MED ORDER — UMECLIDINIUM-VILANTEROL 62.5-25 MCG/ACT IN AEPB
1.0000 | INHALATION_SPRAY | Freq: Every day | RESPIRATORY_TRACT | Status: DC
  Administered 2024-08-31 – 2024-09-05 (×6): 1 via RESPIRATORY_TRACT
  Filled 2024-08-31: qty 14

## 2024-08-31 MED ORDER — METHADONE HCL 10 MG/ML PO CONC
110.0000 mg | Freq: Every day | ORAL | Status: DC
  Administered 2024-09-01 – 2024-09-05 (×5): 110 mg via ORAL
  Filled 2024-08-31 (×6): qty 15

## 2024-08-31 MED ORDER — IPRATROPIUM-ALBUTEROL 0.5-2.5 (3) MG/3ML IN SOLN
3.0000 mL | Freq: Four times a day (QID) | RESPIRATORY_TRACT | Status: AC
  Administered 2024-08-31 – 2024-09-01 (×5): 3 mL via RESPIRATORY_TRACT
  Filled 2024-08-31 (×5): qty 3

## 2024-08-31 MED ORDER — ALBUTEROL SULFATE (2.5 MG/3ML) 0.083% IN NEBU
2.5000 mg | INHALATION_SOLUTION | RESPIRATORY_TRACT | Status: DC | PRN
  Administered 2024-09-02 – 2024-09-03 (×2): 2.5 mg via RESPIRATORY_TRACT
  Filled 2024-08-31 (×2): qty 3

## 2024-08-31 MED ORDER — TRAZODONE HCL 50 MG PO TABS
50.0000 mg | ORAL_TABLET | Freq: Every day | ORAL | Status: DC
  Administered 2024-08-31 – 2024-09-04 (×5): 50 mg via ORAL
  Filled 2024-08-31 (×5): qty 1

## 2024-08-31 MED ORDER — METHYLPHENIDATE HCL 10 MG PO TABS
20.0000 mg | ORAL_TABLET | Freq: Two times a day (BID) | ORAL | Status: DC
  Administered 2024-09-01 – 2024-09-05 (×9): 20 mg via ORAL
  Filled 2024-08-31 (×9): qty 2

## 2024-08-31 MED ORDER — NICOTINE 14 MG/24HR TD PT24
14.0000 mg | MEDICATED_PATCH | Freq: Every day | TRANSDERMAL | Status: DC
  Administered 2024-09-02 – 2024-09-04 (×3): 14 mg via TRANSDERMAL
  Filled 2024-08-31 (×3): qty 1

## 2024-08-31 MED ORDER — ALBUTEROL SULFATE (2.5 MG/3ML) 0.083% IN NEBU
2.5000 mg | INHALATION_SOLUTION | Freq: Once | RESPIRATORY_TRACT | Status: AC
  Administered 2024-08-31: 2.5 mg via RESPIRATORY_TRACT
  Filled 2024-08-31: qty 3

## 2024-08-31 MED ORDER — INFLUENZA VIRUS VACC SPLIT PF (FLUZONE) 0.5 ML IM SUSY
0.5000 mL | PREFILLED_SYRINGE | INTRAMUSCULAR | Status: AC
  Administered 2024-09-01: 0.5 mL via INTRAMUSCULAR
  Filled 2024-08-31: qty 0.5

## 2024-08-31 NOTE — Assessment & Plan Note (Signed)
 COPD exacerbation Patient reports 1 week of upper respiratory tract infection symptoms followed by an increase in sputum production as well as its purulence, reporting less response to her DuoNebs at home with increasing wheeze and shortness of breath Negative for influenza RSV coronavirus Chest x-ray notable for stable chronic appearing perihilar/opacities right greater than left Patient's home regimen is tiotropium and Symbicort  Continue ceftriaxone , p.o. prednisone  40 mg daily for 5 days, scheduled DuoNeb for 6 doses, Anoro Ellipta , brain atretic peptide given perihilar opacities

## 2024-08-31 NOTE — H&P (Addendum)
 History and Physical    Patient: Denise Valentine FMW:[id number] DOB: 1971-05-06 DOA: 08/31/2024 DOS: the patient was seen and examined on 08/31/2024 PCP: Zachary Idelia LABOR, MD  Patient coming from: Home  Chief Complaint:  Chief Complaint  Patient presents with   Shortness of Breath   HPI:  [name] is a 53 year old female with a past medical history of COPD, bipolar disorder, ADHD, opiate use disorder, gastroesophageal reflux disease who presents with 1 week of worsening shortness of breath as well as increase in sputum production as well as purulence.  The patient reports she has had a less response to her home bronchodilators no chest pain fever or chills but now she is dyspneic with minimal activity leading to presentation today.  Twelve-lead ECG independently reviewed interpreted normal sinus rhythm ventricular rate 77 no ST changes there is T wave inversion similar inferior leads which was also seen on 5/23 ECG more flat at that time.  Past medical records reviewed and summarized: Patient's office visit from August 05, 2024: Add referral for skin issue and started on Ritalin  for ADHD at the time.  Review of Systems:  Constitutional: Denies Weight Loss, Fever, Chills or Night Sweats Eyes: Denies Blurry Vision, Eye Pain or Decreased Vision Respiratory: Reports Shortness of Breath, Cough ,  Wheezing but no  Pleurisy Cardiovascular: Denies Chest Pain, Paroxsymal Nocturnal Dyspnea, Palpitations, Edema Gastrointestinal: Denies Nausea, Vomiting, Diarrhea, Hematemesis, Melena Genitourinary: Denies hematuria, dysuria, hesitancy, incontinence Musculoskeletal: Denies Arthralgias, Myalgias, Muscle Weakness, Joint Swelling All other systems were reviewed and are negative  Past Medical History:  Diagnosis Date   ADHD (attention deficit hyperactivity disorder)    Anemia    Anxiety    Arthritis    Asthma    Bipolar disorder (HCC)    COPD (chronic obstructive pulmonary disease) (HCC)     Coronary artery disease    Depression    DVT (deep venous thrombosis) (HCC)    GERD (gastroesophageal reflux disease)    Headache    migraines   Hepatitis C    Neuropathy    Ovarian cyst    Pneumonia    Seizures (HCC)    Sleep apnea    no cpap   Substance abuse (HCC)    Past Surgical History:  Procedure Laterality Date   BACK SURGERY     CHOLECYSTECTOMY     RADIOLOGY WITH ANESTHESIA Left 05/16/2022   Procedure: MRI WITH ANESTHESIA OF LEFT SHOULDER WITHOUT CONTRAST;  Surgeon: Radiologist, Medication, MD;  Location: [facility] OR;  Service: Radiology;  Laterality: Left;   TONSILLECTOMY     TUBAL LIGATION     WISDOM TOOTH EXTRACTION     Social History:  reports that she has been smoking cigarettes. She started smoking about 39 years ago. She has a 16 pack-year smoking history. She has never used smokeless tobacco. She reports that she does not currently use drugs after having used the following drugs: Marijuana and Cocaine. She reports that she does not drink alcohol.  Allergies  Allergen Reactions   Toradol  [Ketorolac  Tromethamine ] Shortness Of Breath and Rash   Tramadol Hives   Carbamazepine Rash   Erythromycin Rash   Fish Allergy Other (See Comments)    Headache    Family History  Problem Relation Age of Onset   CAD Mother     Prior to Admission medications   Medication Sig Start Date End Date Taking? Authorizing Provider  albuterol  (PROVENTIL ) (2.5 MG/3ML) 0.083% nebulizer solution Inhale 3 mLs into the lungs every 6 (six) hours  as needed (Copd/asthma). 05/09/24  Yes Dougherty, Lauren A, PA-C  budesonide -formoterol  (SYMBICORT ) 160-4.5 MCG/ACT inhaler Inhale 2 puffs into the lungs 2 (two) times daily. 05/15/24  Yes [provider]  buPROPion  (WELLBUTRIN  XL) 150 MG 24 hr tablet Take 150 mg by mouth daily.   Yes [provider]  ibuprofen  (ADVIL ) 800 MG tablet Take 800 mg by mouth every 8 (eight) hours as needed for mild pain.   Yes [provider]   METHADONE  HCL PO Take 110 mg by mouth daily. 110mg  liquid;; [facility] [phone]   Yes [provider]  methylphenidate  (RITALIN ) 20 MG tablet Take 20 mg by mouth 2 (two) times daily.   Yes [provider]  pantoprazole  (PROTONIX ) 40 MG tablet Take 40 mg by mouth daily.   Yes [provider]  risperiDONE  (RISPERDAL ) 1 MG tablet Take 1 mg by mouth at bedtime.   Yes [provider]  Tiotropium Bromide  Monohydrate 2.5 MCG/ACT AERS Inhale 5 mcg into the lungs daily. 05/15/24 05/15/25 Yes [provider]  traZODone  (DESYREL ) 50 MG tablet Take 50 mg by mouth at bedtime.   Yes [provider]  famotidine  (PEPCID ) 20 MG tablet Take 1 tablet (20 mg total) by mouth 2 (two) times daily. Patient not taking: Reported on 08/31/2024 02/10/21   Viviann Pastor, MD  methylPREDNISolone  (MEDROL  DOSEPAK) 4 MG TBPK tablet Take 6 pills the first day and decrease by one pill each following day. Patient not taking: Reported on 08/31/2024 05/09/24   Cleaster Tinnie LABOR, PA-C  naproxen  (NAPROSYN ) 500 MG tablet Take 1 tablet (500 mg total) by mouth 2 (two) times daily with a meal. Patient not taking: Reported on 05/10/2022 02/10/21   Viviann Pastor, MD  ondansetron  (ZOFRAN  ODT) 4 MG disintegrating tablet Take 1 tablet (4 mg total) by mouth every 8 (eight) hours as needed for nausea or vomiting. Patient not taking: Reported on 05/10/2022 02/10/21   Viviann Pastor, MD  potassium chloride  (K-DUR) 10 MEQ tablet Take 1 tablet (10 mEq total) by mouth daily. 06/19/17 12/08/20  Trudy Dorn BRAVO, MD    Physical Exam: Vitals:   08/31/24 1230 08/31/24 1330 08/31/24 1420 08/31/24 1559  BP: (!) 127/98 101/61  131/61  Pulse: 76 62 76 89  Resp: 15 20 17 20   Temp:    98.4 F (36.9 C)  TempSrc:    Oral  SpO2: 93% 95% 95% 96%  Weight:      Height:      patient seen at approximately 2:49 PM in room 8 of the emergency department Constitutional:  Vital  Signs as per [facility] than three noted] No Acute Distress Eyes:  Pink Conjunctiva and no Ptosis Neck:     Trachea Midline, Neck Symmetric             Thyroid without tenderness, palpable masses or nodules Respiratory:   Bilateral wheeze with dyspnea and intermittent accessory muscle usage when speaking  Cardiovascular:   Heart Auscultated: Regular Regular without any added sounds or murmurs              No Lower Extremity Edema Gastrointestinal:  Abdomen soft and nontender without palpable masses, guarding or rebound  No Palpable Splenomegaly or Hepatomegaly Psychiatric:  Patient Orientated to Time, Place and Person Patient with appropriate mood and affect Recent and Remote Memory Intact   Data Reviewed: Labs, Radiology, ECG as detailed in HPI and A/P   Assessment and Plan: * COPD exacerbation (HCC) COPD exacerbation Patient reports 1  week of upper respiratory tract infection symptoms followed by an increase in sputum production as well as its purulence, reporting less response to her DuoNebs at home with increasing wheeze and shortness of breath Negative for influenza RSV coronavirus Chest x-ray notable for stable chronic appearing perihilar/opacities right greater than left Patient's home regimen is tiotropium and Symbicort  Continue ceftriaxone , p.o. prednisone  40 mg daily for 5 days, scheduled DuoNeb for 6 doses, Anoro Ellipta , brain atretic peptide given perihilar opacities   Acute hypoxic respiratory failure (HCC) Acute hypoxic respiratory failure Secondary to COPD exacerbation Patient desaturating to less than 86% while speaking and becoming dyspneic although saturating greater than 89% at rest Appears to have a chronic hypercarbia which is currently compensated: VBG revealed pH 7.33 pCO2 64 and HCO3 of 33.7 Plan will be supplemental oxygen titrated to SpO2 greater than 89%   Abnormal CXR Chronic perihilar opacities Radiologist reports these are chronic in nature  with differential diagnosis sarcoid fungal TB atypical pneumonia etc. however her acute presentation appears to be classical COPD exacerbation, will follow CT pulmonary angiogram results to further characterize this and rule out heart failure with brain atretic peptide   ECG abnormal T wave changes Now more inverted than flat from previous comparison, potassium within normal limits will cycle troponins although do not suspect a cardiac event  Positive D dimer Elevated D-dimer Higher than the age-adjusted cutoff in the setting of hypoxic respiratory failure Obtaining CT pulmonary angiogram of the chest   Bipolar 1 disorder (HCC) Bipolar disorder Continue risperidone  1 mg at bedtime as well as trazodone  50 mg at bedtime Continue bupropion  150 mg extended release 24 hours  ADHD ADHD Continue Ritalin  20 mg p.o. twice daily  Nicotine  abuse Nicotine  abuse Reports smoking since 53 years of age approximate 1 pack a day Nicotine  patch  Opiate dependence (HCC) Opiate use disorder Patient reports she is on methadone  110 mg daily through [facility] [phone], called the after-hours line at 7:09 PM to confirm her dose spoke with Garrel who took message expecting a callback also discussed with the pharmacist greenwood reports patient has taken her dose today and the floor pharmacist will be calling in the morning to confirm as well, have not added this medication until we confirm dosage      Advance Care Planning:   Code Status: Full Code   Consults: N/A  Family Communication: nil  Severity of Illness: The appropriate patient status for this patient is INPATIENT. Inpatient status is judged to be reasonable and necessary in order to provide the required intensity of service to ensure the patient's safety. The patient's presenting symptoms, physical exam findings, and initial radiographic and laboratory data in the context of their chronic comorbidities is felt to place  them at high risk for further clinical deterioration. Furthermore, it is not anticipated that the patient will be medically stable for discharge from the hospital within 2 midnights of admission.   * I certify that at the point of admission it is my clinical judgment that the patient will require inpatient hospital care spanning beyond 2 midnights from the point of admission due to high intensity of service, high risk for further deterioration and high frequency of surveillance required.*  -> Acute Resp Failure Clinical Validation Met when speaks  8:09 Judyann Eagles Clinical Director called back [facility] 08/27/2024 last visit  , 14 take home doses given due back, 09/10/2024 110mg   daily methadone  confirmed  Author: Prentice JAYSON Lowenstein, MD 08/31/2024 7:36 PM  For  on call review [email or web address].

## 2024-08-31 NOTE — Assessment & Plan Note (Signed)
 Chronic perihilar opacities Radiologist reports these are chronic in nature with differential diagnosis sarcoid fungal TB atypical pneumonia etc. however her acute presentation appears to be classical COPD exacerbation, will follow CT pulmonary angiogram results to further characterize this and rule out heart failure with brain atretic peptide

## 2024-08-31 NOTE — Assessment & Plan Note (Signed)
 Opiate use disorder Patient reports she is on methadone  110 mg daily through [facility] [phone], called the after-hours line at 7:09 PM to confirm her dose spoke with Garrel who took message expecting a callback also discussed with the pharmacist greenwood reports patient has taken her dose today and the floor pharmacist will be calling in the morning to confirm as well, have not added this medication until we confirm dosage

## 2024-08-31 NOTE — Assessment & Plan Note (Signed)
 Nicotine  abuse Reports smoking since 53 years of age approximate 1 pack a day Nicotine  patch

## 2024-08-31 NOTE — Assessment & Plan Note (Signed)
 Acute hypoxic respiratory failure Secondary to COPD exacerbation Patient desaturating to less than 86% while speaking and becoming dyspneic although saturating greater than 89% at rest Appears to have a chronic hypercarbia which is currently compensated: VBG revealed pH 7.33 pCO2 64 and HCO3 of 33.7 Plan will be supplemental oxygen titrated to SpO2 greater than 89%

## 2024-08-31 NOTE — ED Provider Notes (Signed)
 [facility] Provider Note    Event Date/Time   First MD Initiated Contact with Patient 08/31/24 1115     (approximate)   History   Shortness of Breath   HPI  Denise Valentine is a 53 y.o. female with a history of asthma, COPD, GERD, and hyperlipidemia who presents with shortness of breath, gradual onset for the last week, associated with sore throat and cough.  The patient denies any fever or chest pain.  She states that the symptoms are not relieved by her home inhalers.  I reviewed the past medical records.  The patient's most recent outpatient encounter was on 8/19 with family medicine for evaluation of attention issues as well as skin lesions.   Physical Exam   Triage Vital Signs: ED Triage Vitals  Encounter Vitals Group     BP 08/31/24 1052 121/82     Girls Systolic BP Percentile --      Girls Diastolic BP Percentile --      Boys Systolic BP Percentile --      Boys Diastolic BP Percentile --      Pulse Rate 08/31/24 1052 92     Resp 08/31/24 1050 (!) 26     Temp 08/31/24 1050 98.4 F (36.9 C)     Temp src --      SpO2 08/31/24 1052 98 %     Weight 08/31/24 1051 180 lb (81.6 kg)     Height 08/31/24 1051 5' 7 (1.702 m)     Head Circumference --      Peak Flow --      Pain Score 08/31/24 1051 0     Pain Loc --      Pain Education --      Exclude from Growth Chart --     Most recent vital signs: Vitals:   08/31/24 1330 08/31/24 1420  BP: 101/61   Pulse: 62 76  Resp: 20 17  Temp:    SpO2: 95% 95%     General: Awake, no distress.  CV:  Good peripheral perfusion.  Resp:  Normal effort.  Diffuse wheezing bilaterally. Abd:  No distention.  Other:  No peripheral edema.  Oropharynx with slight erythema but no exudates.   ED Results / Procedures / Treatments   Labs (all labs ordered are listed, but only abnormal results are displayed) Labs Reviewed  BASIC METABOLIC PANEL WITH GFR - Abnormal; Notable for the following components:       Result Value   Glucose, Bld 126 (*)    All other components within normal limits  CBC - Abnormal; Notable for the following components:   RBC 5.14 (*)    All other components within normal limits  RESP PANEL BY RT-PCR (RSV, FLU A&B, COVID)  RVPGX2  EXPECTORATED SPUTUM ASSESSMENT W GRAM STAIN, RFLX TO RESP C  HIV ANTIBODY (ROUTINE TESTING W REFLEX)  CBC  CREATININE, SERUM  BLOOD GAS, VENOUS  D-DIMER, QUANTITATIVE     EKG  ED ECG REPORT I, Waylon Cassis, the attending physician, personally viewed and interpreted this ECG.  Date: 08/31/2024 EKG Time: 1055 Rate: 77 Rhythm: normal sinus rhythm QRS Axis: Right axis Intervals: normal ST/T Wave abnormalities: Nonspecific T wave abnormality Narrative Interpretation: no evidence of acute ischemia    RADIOLOGY  Chest x-ray: I independently viewed and interpreted the images; there are chronic appearing interstitial opacities but no focal consolidation or edema  PROCEDURES:  Critical Care performed: No  Procedures   MEDICATIONS ORDERED IN  ED: Medications  enoxaparin  (LOVENOX ) injection 40 mg (has no administration in time range)  predniSONE  (DELTASONE ) tablet 40 mg (has no administration in time range)  ipratropium-albuterol  (DUONEB) 0.5-2.5 (3) MG/3ML nebulizer solution 3 mL (3 mLs Nebulization Given 08/31/24 1509)  albuterol  (PROVENTIL ) (2.5 MG/3ML) 0.083% nebulizer solution 2.5 mg (has no administration in time range)  umeclidinium-vilanterol (ANORO ELLIPTA ) 62.5-25 MCG/ACT 1 puff (has no administration in time range)  cefTRIAXone  (ROCEPHIN ) 1 g in sodium chloride  0.9 % 100 mL IVPB (1 g Intravenous New Bag/Given 08/31/24 1510)  ipratropium-albuterol  (DUONEB) 0.5-2.5 (3) MG/3ML nebulizer solution 3 mL (3 mLs Nebulization Given 08/31/24 1211)  ipratropium-albuterol  (DUONEB) 0.5-2.5 (3) MG/3ML nebulizer solution 3 mL (3 mLs Nebulization Given 08/31/24 1210)  methylPREDNISolone  sodium succinate (SOLU-MEDROL ) 125 mg/2 mL  injection 125 mg (125 mg Intravenous Given 08/31/24 1230)  sodium chloride  0.9 % bolus 500 mL (500 mLs Intravenous New Bag/Given 08/31/24 1228)  albuterol  (PROVENTIL ) (2.5 MG/3ML) 0.083% nebulizer solution 2.5 mg (2.5 mg Nebulization Given 08/31/24 1358)  albuterol  (PROVENTIL ) (2.5 MG/3ML) 0.083% nebulizer solution 2.5 mg (2.5 mg Nebulization Given 08/31/24 1358)     IMPRESSION / MDM / ASSESSMENT AND PLAN / ED COURSE  I reviewed the triage vital signs and the nursing notes.  53 year old female with PMH as noted above presents with shortness of breath, cough, wheezing over the last week.  On exam the patient has diffuse wheezing bilaterally.  O2 saturation is in the low 90s on room air.  Differential diagnosis includes, but is not limited to, asthma exacerbation, COPD exacerbation, acute bronchitis, less likely pneumonia.  There is no evidence of cardiac cause.  Chest x-ray is clear.  BMP and CBC show no acute findings EKG is nonischemic.  We will obtain respiratory panel, give bronchodilators and steroid, a fluid bolus, and reassess.  Patient's presentation is most consistent with acute complicated illness / injury requiring diagnostic workup.  The patient is on the cardiac monitor to evaluate for evidence of arrhythmia and/or significant heart rate changes.  ----------------------------------------- 2:03 PM on 08/31/2024 -----------------------------------------  The patient remains with persistent significant wheezing and shortness of breath on minimal exertion.  She will benefit from inpatient admission for further treatment.  I consulted the hospitalist; based on our discussion he agreed to evaluate the patient for admission.   FINAL CLINICAL IMPRESSION(S) / ED DIAGNOSES   Final diagnoses:  COPD exacerbation (HCC)     Rx / DC Orders   ED Discharge Orders     None        Note:  This document was prepared using Dragon voice recognition software and may include unintentional  dictation errors.   Jacolyn Pae, MD 08/31/24 1510

## 2024-08-31 NOTE — Assessment & Plan Note (Signed)
 Elevated D-dimer Higher than the age-adjusted cutoff in the setting of hypoxic respiratory failure Obtaining CT pulmonary angiogram of the chest

## 2024-08-31 NOTE — ED Triage Notes (Signed)
 Pt to ED for shobx1 week. Reports has been sick with worsening shob. Hx COPD, asthma, emphysema. Wheezing noted.

## 2024-08-31 NOTE — Assessment & Plan Note (Signed)
 T wave changes Now more inverted than flat from previous comparison, potassium within normal limits will cycle troponins although do not suspect a cardiac event

## 2024-08-31 NOTE — Assessment & Plan Note (Signed)
 Bipolar disorder Continue risperidone  1 mg at bedtime as well as trazodone  50 mg at bedtime Continue bupropion  150 mg extended release 24 hours

## 2024-08-31 NOTE — Assessment & Plan Note (Signed)
 ADHD Continue Ritalin  20 mg p.o. twice daily

## 2024-09-01 ENCOUNTER — Inpatient Hospital Stay (HOSPITAL_COMMUNITY)
Admit: 2024-09-01 | Discharge: 2024-09-01 | Disposition: A | Discharge status: Home / Self Care | Payer: MEDICAID | Attending: Internal Medicine | Admitting: Internal Medicine

## 2024-09-01 DIAGNOSIS — J441 Chronic obstructive pulmonary disease with (acute) exacerbation: Secondary | ICD-10-CM | POA: Diagnosis not present

## 2024-09-01 DIAGNOSIS — R9431 Abnormal electrocardiogram [ECG] [EKG]: Secondary | ICD-10-CM

## 2024-09-01 LAB — COMPREHENSIVE METABOLIC PANEL WITH GFR
ALT: 6 U/L (ref 0–44)
AST: 15 U/L (ref 15–41)
Albumin: 3.1 g/dL — ABNORMAL LOW (ref 3.5–5.0)
Alkaline Phosphatase: 68 U/L (ref 38–126)
Anion gap: 8 (ref 5–15)
BUN: 10 mg/dL (ref 6–20)
CO2: 29 mmol/L (ref 22–32)
Calcium: 8.4 mg/dL — ABNORMAL LOW (ref 8.9–10.3)
Chloride: 104 mmol/L (ref 98–111)
Creatinine, Ser: 0.67 mg/dL (ref 0.44–1.00)
GFR, Estimated: >60 mL/min (ref >60)
Glucose, Bld: 141 mg/dL — ABNORMAL HIGH (ref 70–99)
Potassium: 4.2 mmol/L (ref 3.5–5.1)
Sodium: 141 mmol/L (ref 135–145)
Total Bilirubin: 0.4 mg/dL (ref 0.0–1.2)
Total Protein: 6.2 g/dL — ABNORMAL LOW (ref 6.5–8.1)

## 2024-09-01 LAB — ECHOCARDIOGRAM COMPLETE
Area-P 1/2: 4.36 cm2
Height: 67 in
Weight: 2880 [oz_av]

## 2024-09-01 LAB — CBC
HCT: 41.6 % (ref 36.0–46.0)
Hemoglobin: 13.4 g/dL (ref 12.0–15.0)
MCH: 28 pg (ref 26.0–34.0)
MCHC: 32.2 g/dL (ref 30.0–36.0)
MCV: 87 fL (ref 80.0–100.0)
Platelets: 227 K/uL (ref 150–400)
RBC: 4.78 MIL/uL (ref 3.87–5.11)
RDW: 12.9 % (ref 11.5–15.5)
WBC: 5.7 K/uL (ref 4.0–10.5)
nRBC: 0 % (ref 0.0–0.2)

## 2024-09-01 LAB — HIV ANTIBODY (ROUTINE TESTING W REFLEX): HIV Screen 4th Generation wRfx: NONREACTIVE

## 2024-09-01 MED ORDER — ADULT MULTIVITAMIN W/MINERALS CH
1.0000 | ORAL_TABLET | Freq: Every day | ORAL | Status: DC
  Administered 2024-09-01 – 2024-09-05 (×5): 1 via ORAL
  Filled 2024-09-01 (×6): qty 1

## 2024-09-01 MED ORDER — GUAIFENESIN-DM 100-10 MG/5ML PO SYRP
10.0000 mL | ORAL_SOLUTION | ORAL | Status: DC | PRN
  Administered 2024-09-01 – 2024-09-04 (×6): 10 mL via ORAL
  Filled 2024-09-01 (×6): qty 10

## 2024-09-01 NOTE — Progress Notes (Signed)
 Met with patient at bedside. Introduced to role of Statistician.   Patient endorses having both a primary care provider and a mental health provider, along with an array of other speciality providers.   Patient denies having any unmet SDOH needs at present. Denies substance use/abuse.  Encouraged to call with questions or concerns.

## 2024-09-01 NOTE — Progress Notes (Signed)
 PROGRESS NOTE Denise Valentine  FMW:[id number] DOB: 1971/03/13 DOA: 08/31/2024 PCP: Zachary Idelia LABOR, MD  Brief Narrative/Hospital Course: Denise Valentine is a 53 y.o. female with PMH of COPD, bipolar disorder, ADHD, opiate use disorder, gastroesophageal reflux disease who presents with 1 week of worsening shortness of breath as well as increase in sputum production as well as purulence not improved with home bronchodilators  In ED-vital stable on room air.  Labs reviewed pCO2 64 with pH 7.3 Twelve-lead ECG nsr- HR 77 no ST changes there is T wave inversion similar inferior leads which was also seen on 5/23 ECG more flat at that time. Her chest x-ray and along with CT angio chest> no PE, multiple nodules bilaterally stable from 717 Patient was admitted for COPD exacerbation  Subjective: Seen and examined today Overnight afebrile BP stable on room air Labs reviewed stable CMP this morning troponin negative x 2 BNP normal D-dimer 0.6 on admission Still complains of still been short of breath mostly with activity currently on room air Son is sick at home  Assessment and plan:  Acute COPD exacerbation Acute hypoxic respiratory failure due to COPD Pulmonary nodules-stable Slightly positive D-dimer: CT angio chest reviewed as above no PE.  Continue management of septic cerebration Reports morning Appears without symptoms, with sick contact, currently wheezing, currently on room air.  Continue current antibiotics, systemic steroids bronchodilators Anoro Ellipta    ECG abnormal T wave changes: Now more inverted than flat from previous comparison, potassium within normal limits will cycle troponins although do not suspect a cardiac event. Troponin negative x 2 and BNP normal.  Obtain echocardiogram for completeness   Bipolar 1 disorder ADHD: Continue risperidone  1 mg at bedtime as well as trazodone  50 mg at bedtime Continue bupropion  150 mg extended release 24 hours Continue Ritalin  20 mg p.o. twice  daily   Nicotine  abuse Nicotine  patch   Opiate dependenc: Patient reports she is on methadone  110 mg daily through [facility] recovery solutions [id number]> CONT SAME   Mobility: PT Orders: Active  PT Follow up Rec: No Pt Follow Up9/15/2025 1139   DVT prophylaxis: enoxaparin  (LOVENOX ) injection 40 mg Start: 08/31/24 2200 SCDs Start: 08/31/24 1456 Code Status:   Code Status: Full Code Family Communication: plan of care discussed with patient at bedside. Patient status is: Remains hospitalized because of severity of illness Level of care: Telemetry Medical   Dispo: The patient is from: HOME            Anticipated disposition: TBD Objective: Vitals last 24 hrs: Vitals:   09/01/24 0411 09/01/24 0725 09/01/24 0730 09/01/24 1155  BP: 106/63 121/66  112/69  Pulse: 65 63  77  Resp: 18 18  18   Temp: (!) 97.5 F (36.4 C) 97.7 F (36.5 C)  98.7 F (37.1 C)  TempSrc:    Oral  SpO2: 90% 95% 96% 95%  Weight:      Height:        Physical Examination: General exam: alert awake, oriented, older than stated age HEENT:Oral mucosa moist, Ear/Nose WNL grossly Respiratory system: Bilaterally diminished breath sound with expiratory wheezing,no use of accessory muscle Cardiovascular system: S1 & S2 +, No JVD. Gastrointestinal system: Abdomen soft,NT,ND, BS+ Nervous System: Alert, awake, moving all extremities,and following commands. Extremities: extremities warm, leg edemaNEG Skin: No rashes,no icterus. MSK: Normal muscle bulk,tone, power   Medications reviewed:  Scheduled Meds:  buPROPion   150 mg Oral Daily   enoxaparin  (LOVENOX ) injection  40 mg Subcutaneous Q24H   methadone   110 mg  Oral Daily   methylphenidate   20 mg Oral BID   multivitamin with minerals  1 tablet Oral Daily   nicotine   14 mg Transdermal Daily   pantoprazole   40 mg Oral Daily   predniSONE   40 mg Oral Q breakfast   risperiDONE   1 mg Oral QHS   traZODone   50 mg Oral QHS   umeclidinium-vilanterol  1 puff  Inhalation Daily   Continuous Infusions:  cefTRIAXone  (ROCEPHIN )  IV Stopped (08/31/24 1630)   Diet: Diet Order             Diet regular Room service appropriate? Yes; Fluid consistency: Thin  Diet effective now                    Data Reviewed: I have personally reviewed following labs and imaging studies ( see epic result tab) CBC: Recent Labs  Lab 08/31/24 1053 09/01/24 0339  WBC 5.2 5.7  HGB 14.2 13.4  HCT 45.2 41.6  MCV 87.9 87.0  PLT 218 227   CMP: Recent Labs  Lab 08/31/24 1053 09/01/24 0339  NA 140 141  K 4.4 4.2  CL 99 104  CO2 26 29  GLUCOSE 126* 141*  BUN 7 10  CREATININE 0.74 0.67  CALCIUM 8.9 8.4*   GFR: Estimated Creatinine Clearance: 90.4 mL/min (by C-G formula based on SCr of 0.67 mg/dL). Recent Labs  Lab 09/01/24 0339  AST 15  ALT 6  ALKPHOS 68  BILITOT 0.4  PROT 6.2*  ALBUMIN 3.1*   No results for input(s): LIPASE, AMYLASE in the last 168 hours. No results for input(s): AMMONIA in the last 168 hours. Coagulation Profile: No results for input(s): INR, PROTIME in the last 168 hours. Unresulted Labs (From admission, onward)     Start     Ordered   09/07/24 0500  Creatinine, serum  (enoxaparin  (LOVENOX )    CrCl >/= 30 ml/min)  Weekly,   STAT     Comments: while on enoxaparin  therapy    08/31/24 1458   09/02/24 0500  Basic metabolic panel with GFR  Daily,   R      09/01/24 1216   09/02/24 0500  CBC  Daily,   R      09/01/24 1216   08/31/24 1458  Expectorated Sputum Assessment w Gram Stain, Rflx to Resp Cult  Once,   R        08/31/24 1458           Antimicrobials/Microbiology: Anti-infectives (From admission, onward)    Start     Dose/Rate Route Frequency Ordered Stop   08/31/24 1500  cefTRIAXone  (ROCEPHIN ) 1 g in sodium chloride  0.9 % 100 mL IVPB        1 g 200 mL/hr over 30 Minutes Intravenous Every 24 hours 08/31/24 1458 09/05/24 1459         Component Value Date/Time   SDES BLOOD RIGHT WRIST 02/27/2016  0714   SDES BLOOD LEFT WRIST 02/27/2016 0714   SPECREQUEST BOTTLES DRAWN AEROBIC AND ANAEROBIC 5CCAERO,2CCANA 02/27/2016 0714   SPECREQUEST BOTTLES DRAWN AEROBIC AND ANAEROBIC 5CCAERO,1CCANA 02/27/2016 0714   CULT NO GROWTH 5 DAYS 02/27/2016 0714   CULT NO GROWTH 5 DAYS 02/27/2016 0714   REPTSTATUS 03/03/2016 FINAL 02/27/2016 0714   REPTSTATUS 03/03/2016 FINAL 02/27/2016 9285    Procedures:    Mennie LAMY, MD [facility] 09/01/2024, 2:07 PM

## 2024-09-01 NOTE — Evaluation (Signed)
 Physical Therapy Evaluation Patient Details Name: Tamzin Bertling MRN: [id number] DOB: 09-27-1971 Today's Date: 09/01/2024  History of Present Illness  Ms. Cannella is a 53 year old female with a past medical history of COPD, bipolar disorder, ADHD, opiate use disorder, gastroesophageal reflux disease who presents with 1 week of worsening shortness of breath as well as increase in sputum production as well as purulence.  Clinical Impression  Pt is a pleasant 53 year old female who was admitted for worsening SOB. Pt performs bed mobility, transfers, and ambulation with independence. Pt did not require use of AD while ambulating but required multiple standing rest breaks 2/2 fatigue. SpO2 was monitored throughout session and SpO2 stayed WNL. SpO2 dropped to 89% at the lowest, however pt was able to recover to >90% with verbal cues through PLB.  Pt demonstrates deficits with activity tolerance. Pt demonstrates all bed mobility/transfers/ambulation at baseline level. Pt does not require any further PT needs at this time. Pt will be dc in house and does not require follow up. RN aware. Will dc current orders.          If plan is discharge home, recommend the following: Help with stairs or ramp for entrance   Can travel by private vehicle        Equipment Recommendations None recommended by PT  Recommendations for Other Services       Functional Status Assessment Patient has not had a recent decline in their functional status     Precautions / Restrictions Precautions Precautions: Fall Recall of Precautions/Restrictions: Intact Restrictions Weight Bearing Restrictions Per Provider Order: No      Mobility  Bed Mobility Overal bed mobility: Independent             General bed mobility comments: Able to move from supine<> sit independently    Transfers Overall transfer level: Independent Equipment used: None               General transfer comment: STS from EOB at lowest  height independently. Used UEs to push up from bed    Ambulation/Gait Ambulation/Gait assistance: Modified independent (Device/Increase time) Gait Distance (Feet): 200 Feet Assistive device: None Gait Pattern/deviations: WFL(Within Functional Limits) Gait velocity: dec     General Gait Details: Multiple standing rest breaks taken to recover breath. SpO2 stayed WNL throughout ambulation. Verbal cues for PLB  Stairs            Wheelchair Mobility     Tilt Bed    Modified Rankin (Stroke Patients Only)       Balance                                             Pertinent Vitals/Pain Pain Assessment Pain Assessment: No/denies pain    Home Living Family/patient expects to be discharged to:: Private residence Living Arrangements: Children Available Help at Discharge: Family;Available 24 hours/day (son) Type of Home: House Home Access: Stairs to enter Entrance Stairs-Rails: None Entrance Stairs-Number of Steps: 4   Home Layout: One level Home Equipment: None      Prior Function Prior Level of Function : Independent/Modified Independent;Driving             Mobility Comments: Independent with mobiltiy. No AD used. Pt reports she hasn't been able to walk long distances at baseline. ADLs Comments: Independent with ADLs. Pt reports son does chores and grocery shopping  Extremity/Trunk Assessment   Upper Extremity Assessment Upper Extremity Assessment: Overall WFL for tasks assessed    Lower Extremity Assessment Lower Extremity Assessment: Overall WFL for tasks assessed    Cervical / Trunk Assessment Cervical / Trunk Assessment: Normal  Communication   Communication Communication: No apparent difficulties    Cognition Arousal: Alert Behavior During Therapy: WFL for tasks assessed/performed   PT - Cognitive impairments: No apparent impairments                       PT - Cognition Comments: Pt is pleasant and agreeable  to PT session Following commands: Intact       Cueing Cueing Techniques: Verbal cues     General Comments      Exercises     Assessment/Plan    PT Assessment Patient does not need any further PT services  PT Problem List         PT Treatment Interventions      PT Goals (Current goals can be found in the Care Plan section)  Acute Rehab PT Goals Patient Stated Goal: to feel better PT Goal Formulation: With patient Time For Goal Achievement: 09/15/24 Potential to Achieve Goals: Good    Frequency       Co-evaluation               AM-PAC PT 6 Clicks Mobility  Outcome Measure Help needed turning from your back to your side while in a flat bed without using bedrails?: None Help needed moving from lying on your back to sitting on the side of a flat bed without using bedrails?: None Help needed moving to and from a bed to a chair (including a wheelchair)?: None Help needed standing up from a chair using your arms (e.g., wheelchair or bedside chair)?: None Help needed to walk in hospital room?: None Help needed climbing 3-5 steps with a railing? : A Little 6 Click Score: 23    End of Session   Activity Tolerance: Patient tolerated treatment well Patient left: in bed;with call bell/phone within reach Nurse Communication: Mobility status PT Visit Diagnosis: Difficulty in walking, not elsewhere classified (R26.2)    Time: 0911-0920 PT Time Calculation (min) (ACUTE ONLY): 9 min   Charges:                 Analaya Hoey, SPT   Kristy Schomburg 09/01/2024, 11:48 AM

## 2024-09-01 NOTE — Progress Notes (Signed)
 Initial Nutrition Assessment  DOCUMENTATION CODES:   Not applicable  INTERVENTION:   -Continue regular diet -MVI with minerals daily -Magic cup BID with meals, each supplement provides 290 kcal and 9 grams of protein   NUTRITION DIAGNOSIS:   Increased nutrient needs related to chronic illness (COPD) as evidenced by estimated needs.  GOAL:   Patient will meet greater than or equal to 90% of their needs  MONITOR:   PO intake, Supplement acceptance  REASON FOR ASSESSMENT:   Consult Assessment of nutrition requirement/status  ASSESSMENT:   Pt with a past medical history of COPD, bipolar disorder, ADHD, opiate use disorder, gastroesophageal reflux disease who presents with 1 week of worsening shortness of breath as well as increase in sputum production as well as purulence PTA.  The patient reports she has had a less response to her home bronchodilators no chest pain fever or chills but now she is dyspneic with minimal activity.  Pt admitted with COPD exacerbation.   Reviewed I/O's: +660 ml x 24 hours  Pt walking around in hallways,. Working with PT at time of visit.   Pt offered no complaints.   Pt currently on a regular diet. Pt with good appetite. Noted meal completions 75-100%.   Reviewed wt hx; wt has been stable over thew past 4 months, however, noted distant history of weight loss.   Medications reviewed and include lovenox , ritaline, protonix , and prednisone .   Labs reviewed.    NUTRITION - FOCUSED PHYSICAL EXAM:  Flowsheet Row Most Recent Value  Orbital Region No depletion  Upper Arm Region No depletion  Thoracic and Lumbar Region No depletion  Buccal Region No depletion  Temple Region No depletion  Clavicle Bone Region No depletion  Clavicle and Acromion Bone Region No depletion  Scapular Bone Region No depletion  Dorsal Hand No depletion  Patellar Region No depletion  Anterior Thigh Region No depletion  Posterior Calf Region No depletion  Edema  (RD Assessment) None  Hair Reviewed  Eyes Reviewed  Mouth Reviewed  Skin Reviewed  Nails Reviewed    Diet Order:   Diet Order             Diet regular Room service appropriate? Yes; Fluid consistency: Thin  Diet effective now                   EDUCATION NEEDS:   No education needs have been identified at this time  Skin:  Skin Assessment: Reviewed RN Assessment  Last BM:  08/29/24  Height:   Ht Readings from Last 1 Encounters:  08/31/24 5' 7 (1.702 m)    Weight:   Wt Readings from Last 1 Encounters:  08/31/24 81.6 kg    Ideal Body Weight:  61.4 kg  BMI:  Body mass index is 28.19 kg/m.  Estimated Nutritional Needs:   Kcal:  1850-2050  Protein:  90-105 grams  Fluid:  1.8-2.0 L    Margery ORN, RD, LDN, CDCES Registered Dietitian III Certified Diabetes Care and Education Specialist If unable to reach this RD, please use RD Inpatient group chat on secure chat between hours of 8am-4 pm daily

## 2024-09-01 NOTE — Discharge Instructions (Signed)
 Your nurse navigator, Cleotilde Spadaccini, can be reached at [phone]

## 2024-09-01 NOTE — Progress Notes (Signed)
 [facility] recovery [phone]) to confirm methadone  dose. RN at clinic confirmed patient receives methadone  110 mg po daily. Last dose on 08/27/24 - also received 13 doses from clinic to take home.  Please note, patient will need to return unused methadone  (due to inpatient stay) to clinic at next appointment. On discharge, please print copy of MAR (preferably signed by nursing staff) to help clinic confirm patient's last dose of methadone  inpatient and how much unused methadone  will need to be returned by patient.  Thank you for involving pharmacy in this patient's care.   Damien Napoleon, PharmD Clinical Pharmacist 09/01/2024 10:01 AM

## 2024-09-01 NOTE — Progress Notes (Signed)
 OT Cancellation Note  Patient Details Name: Denise Valentine MRN: [id number] DOB: [birth year]   Cancelled Treatment:    Reason Eval/Treat Not Completed: OT screened, no needs identified, will sign off. Order received, chart reviewed. Pt seen walking in hallway with PT, appears back to baseline functional independence. No skilled OT needs identified. Will sign off. Please re-consult if additional needs arise.   Elston Slot, M.S. OTR/L  09/01/24, 9:22 AM  ascom [phone]

## 2024-09-01 NOTE — Hospital Course (Addendum)
 Denise Valentine is a 53 y.o. female with PMH of COPD, bipolar disorder, ADHD, opiate use disorder, gastroesophageal reflux disease who presents with 1 week of worsening shortness of breath as well as increase in sputum production as well as purulence not improved with home bronchodilators  In ED-vital stable on room air.  Labs reviewed pCO2 64 with pH 7.3 Twelve-lead ECG nsr- HR 77 no ST changes there is T wave inversion similar inferior leads which was also seen on 5/23 ECG more flat at that time. Her chest x-ray and along with CT angio chest> no PE, multiple nodules bilaterally stable from 717 Patient was admitted for COPD exacerbation  Subjective: Seen and examined Still having cough wheezing shortness of breath with activity Overnight afebrile on room air  Labs reviewed this morning stable  Assessment and plan:  Acute COPD exacerbation Acute hypoxic respiratory failure due to COPD Pulmonary nodules-stable Slightly positive D-dimer: CT angio no PE.  Sick contacts at home, will change to IV steroid due to ongoing wheezing. Continue antibiotics bronchodilators and oral Ellipta.  respiratory status stable on room air, although still symptomatic Will ambulate and check spo2 and symptoms    ECG abnormal T wave changes: On admission more inverted than flat from previous comparison, serial troponins have been negative, BNP normal Echo was obtained that showed EF 60 to 65% no RWMA RV SF normal mitral valve aortic valve normal Patient without chest pain follow-up outpatient  Bipolar 1 disorder ADHD: Mood stable, on risperidone  , trazodone  , bupropion  ,Ritalin  .   Nicotine  abuse Cont Nicotine  patch   Opiate dependenc: Patient reports she is on methadone  110 mg daily through [facility] recovery solutions [id number]> CONT SAME   Mobility: PT Orders: Active  PT Follow up Rec: No Pt Follow Up9/15/2025 1139   DVT prophylaxis: enoxaparin  (LOVENOX ) injection 40 mg Start: 08/31/24 2200 SCDs  Start: 08/31/24 1456 Code Status:   Code Status: Full Code Family Communication: plan of care discussed with patient at bedside. Patient status is: Remains hospitalized because of severity of illness Level of care: Telemetry Medical   Dispo: The patient is from: HOME            Anticipated disposition: TBD Objective: Vitals last 24 hrs: Vitals:   09/02/24 0601 09/02/24 0838 09/02/24 0951 09/02/24 1200  BP: 112/69 126/84    Pulse: 61 68    Resp: 17 18    Temp: 97.7 F (36.5 C) 97.7 F (36.5 C)    TempSrc: Oral     SpO2: 96% 95% 96% 96%  Weight:      Height:        Physical Examination: General exam: alert awake, oriented HEENT:Oral mucosa moist, Ear/Nose WNL grossly Respiratory system: Bilaterally diffuse expiratory wheezing,no use of accessory muscle Cardiovascular system: S1 & S2 +, No JVD. Gastrointestinal system: Abdomen soft,NT,ND, BS+ Nervous System: Alert, awake, moving all extremities,and following commands. Extremities: extremities warm, leg edemaNEG Skin: No rashes,no icterus. MSK: Normal muscle bulk,tone, power   Medications reviewed:  Scheduled Meds:  buPROPion   150 mg Oral Daily   enoxaparin  (LOVENOX ) injection  40 mg Subcutaneous Q24H   methadone   110 mg Oral Daily   methylphenidate   20 mg Oral BID   methylPREDNISolone  (SOLU-MEDROL ) injection  40 mg Intravenous Q12H   multivitamin with minerals  1 tablet Oral Daily   nicotine   14 mg Transdermal Daily   pantoprazole   40 mg Oral Daily   risperiDONE   1 mg Oral QHS   traZODone   50 mg Oral  QHS   umeclidinium-vilanterol  1 puff Inhalation Daily   Continuous Infusions:  cefTRIAXone  (ROCEPHIN )  IV 1 g (09/01/24 1624)   Diet: Diet Order             Diet regular Room service appropriate? Yes; Fluid consistency: Thin  Diet effective now

## 2024-09-02 DIAGNOSIS — J441 Chronic obstructive pulmonary disease with (acute) exacerbation: Secondary | ICD-10-CM | POA: Diagnosis not present

## 2024-09-02 LAB — CBC
HCT: 38.7 % (ref 36.0–46.0)
Hemoglobin: 12.2 g/dL (ref 12.0–15.0)
MCH: 27.5 pg (ref 26.0–34.0)
MCHC: 31.5 g/dL (ref 30.0–36.0)
MCV: 87.4 fL (ref 80.0–100.0)
Platelets: 238 K/uL (ref 150–400)
RBC: 4.43 MIL/uL (ref 3.87–5.11)
RDW: 13.3 % (ref 11.5–15.5)
WBC: 8.9 K/uL (ref 4.0–10.5)
nRBC: 0 % (ref 0.0–0.2)

## 2024-09-02 LAB — BASIC METABOLIC PANEL WITH GFR
Anion gap: 8 (ref 5–15)
BUN: 10 mg/dL (ref 6–20)
CO2: 28 mmol/L (ref 22–32)
Calcium: 8.2 mg/dL — ABNORMAL LOW (ref 8.9–10.3)
Chloride: 104 mmol/L (ref 98–111)
Creatinine, Ser: 0.74 mg/dL (ref 0.44–1.00)
GFR, Estimated: >60 mL/min (ref >60)
Glucose, Bld: 102 mg/dL — ABNORMAL HIGH (ref 70–99)
Potassium: 4 mmol/L (ref 3.5–5.1)
Sodium: 140 mmol/L (ref 135–145)

## 2024-09-02 LAB — EXPECTORATED SPUTUM ASSESSMENT W GRAM STAIN, RFLX TO RESP C

## 2024-09-02 MED ORDER — METHYLPREDNISOLONE SODIUM SUCC 40 MG IJ SOLR
40.0000 mg | Freq: Two times a day (BID) | INTRAMUSCULAR | Status: DC
  Administered 2024-09-02 – 2024-09-05 (×6): 40 mg via INTRAVENOUS
  Filled 2024-09-02 (×6): qty 1

## 2024-09-02 MED ORDER — ALPRAZOLAM 0.25 MG PO TABS
0.2500 mg | ORAL_TABLET | Freq: Two times a day (BID) | ORAL | Status: DC | PRN
  Administered 2024-09-02 – 2024-09-04 (×3): 0.25 mg via ORAL
  Filled 2024-09-02 (×3): qty 1

## 2024-09-02 NOTE — Progress Notes (Signed)
   09/02/24 1200  Oxygen Therapy  SpO2 96 %  O2 Device Room Air  Mobility  Activity Ambulated with assistance;Ambulated independently  Level of Assistance Independent after set-up  Assistive Device None  Distance Ambulated (ft) 130 ft  Range of Motion/Exercises Active;Active Assistive  Activity Response Tolerated well  Mobility visit 1 Mobility  Mobility Specialist Start Time (ACUTE ONLY) 1144  Mobility Specialist Stop Time (ACUTE ONLY) 1153  Mobility Specialist Time Calculation (min) (ACUTE ONLY) 9 min   Mobility Specialist - Progress Note Pt was in the bed upon entry. Pt agreed to mobility. Pt did describe harsh coughs when they are active. Pt was able to EOB without assistance. Pt was able to STS without assistance. Pt was able to ambulate well without 2 WW throughout activity. Pt did pause during activity for recovery. O2 levels did not drop below 90%, during recovery pt O2 levels increased. After activity pt returned to the bed with needs in reach and door left open per pt request.  Denise Valentine Mobility Specialist 09/02/24, 12:26 PM

## 2024-09-02 NOTE — Plan of Care (Signed)
  Problem: Pain Managment: Goal: General experience of comfort will improve and/or be controlled Outcome: Progressing   Problem: Safety: Goal: Ability to remain free from injury will improve Outcome: Progressing

## 2024-09-02 NOTE — Progress Notes (Signed)
 PROGRESS NOTE Denise Valentine  FMW:[id number] DOB: 06/07/1971 DOA: 08/31/2024 PCP: Zachary Idelia LABOR, MD  Brief Narrative/Hospital Course: Denise Valentine is a 53 y.o. female with PMH of COPD, bipolar disorder, ADHD, opiate use disorder, gastroesophageal reflux disease who presents with 1 week of worsening shortness of breath as well as increase in sputum production as well as purulence not improved with home bronchodilators  In ED-vital stable on room air.  Labs reviewed pCO2 64 with pH 7.3 Twelve-lead ECG nsr- HR 77 no ST changes there is T wave inversion similar inferior leads which was also seen on 5/23 ECG more flat at that time. Her chest x-ray and along with CT angio chest> no PE, multiple nodules bilaterally stable from 717 Patient was admitted for COPD exacerbation  Subjective: Seen and examined Still having cough wheezing shortness of breath with activity Overnight afebrile on room air  Labs reviewed this morning stable  Assessment and plan:  Acute COPD exacerbation Acute hypoxic respiratory failure due to COPD Pulmonary nodules-stable Slightly positive D-dimer: CT angio no PE.  Sick contacts at home, will change to IV steroid due to ongoing wheezing. Continue antibiotics bronchodilators and oral Ellipta.  respiratory status stable on room air, although still symptomatic Will ambulate and check spo2 and symptoms    ECG abnormal T wave changes: On admission more inverted than flat from previous comparison, serial troponins have been negative, BNP normal Echo was obtained that showed EF 60 to 65% no RWMA RV SF normal mitral valve aortic valve normal Patient without chest pain follow-up outpatient  Bipolar 1 disorder ADHD: Mood stable, on risperidone  , trazodone  , bupropion  ,Ritalin  .   Nicotine  abuse Cont Nicotine  patch   Opiate dependenc: Patient reports she is on methadone  110 mg daily through [facility] recovery solutions [id number]> CONT SAME   Mobility: PT Orders:  Active  PT Follow up Rec: No Pt Follow Up9/15/2025 1139   DVT prophylaxis: enoxaparin  (LOVENOX ) injection 40 mg Start: 08/31/24 2200 SCDs Start: 08/31/24 1456 Code Status:   Code Status: Full Code Family Communication: plan of care discussed with patient at bedside. Patient status is: Remains hospitalized because of severity of illness Level of care: Telemetry Medical   Dispo: The patient is from: HOME            Anticipated disposition: TBD Objective: Vitals last 24 hrs: Vitals:   09/02/24 0601 09/02/24 0838 09/02/24 0951 09/02/24 1200  BP: 112/69 126/84    Pulse: 61 68    Resp: 17 18    Temp: 97.7 F (36.5 C) 97.7 F (36.5 C)    TempSrc: Oral     SpO2: 96% 95% 96% 96%  Weight:      Height:        Physical Examination: General exam: alert awake, oriented HEENT:Oral mucosa moist, Ear/Nose WNL grossly Respiratory system: Bilaterally diffuse expiratory wheezing,no use of accessory muscle Cardiovascular system: S1 & S2 +, No JVD. Gastrointestinal system: Abdomen soft,NT,ND, BS+ Nervous System: Alert, awake, moving all extremities,and following commands. Extremities: extremities warm, leg edemaNEG Skin: No rashes,no icterus. MSK: Normal muscle bulk,tone, power   Medications reviewed:  Scheduled Meds:  buPROPion   150 mg Oral Daily   enoxaparin  (LOVENOX ) injection  40 mg Subcutaneous Q24H   methadone   110 mg Oral Daily   methylphenidate   20 mg Oral BID   methylPREDNISolone  (SOLU-MEDROL ) injection  40 mg Intravenous Q12H   multivitamin with minerals  1 tablet Oral Daily   nicotine   14 mg Transdermal Daily   pantoprazole   40 mg Oral Daily   risperiDONE   1 mg Oral QHS   traZODone   50 mg Oral QHS   umeclidinium-vilanterol  1 puff Inhalation Daily   Continuous Infusions:  cefTRIAXone  (ROCEPHIN )  IV 1 g (09/01/24 1624)   Diet: Diet Order             Diet regular Room service appropriate? Yes; Fluid consistency: Thin  Diet effective now                       Data Reviewed: I have personally reviewed following labs and imaging studies ( see epic result tab) CBC: Recent Labs  Lab 08/31/24 1053 09/01/24 0339 09/02/24 0322  WBC 5.2 5.7 8.9  HGB 14.2 13.4 12.2  HCT 45.2 41.6 38.7  MCV 87.9 87.0 87.4  PLT 218 227 238   CMP: Recent Labs  Lab 08/31/24 1053 09/01/24 0339 09/02/24 0322  NA 140 141 140  K 4.4 4.2 4.0  CL 99 104 104  CO2 26 29 28   GLUCOSE 126* 141* 102*  BUN 7 10 10   CREATININE 0.74 0.67 0.74  CALCIUM 8.9 8.4* 8.2*   GFR: Estimated Creatinine Clearance: 90.4 mL/min (by C-G formula based on SCr of 0.74 mg/dL). Recent Labs  Lab 09/01/24 0339  AST 15  ALT 6  ALKPHOS 68  BILITOT 0.4  PROT 6.2*  ALBUMIN 3.1*   No results for input(s): LIPASE, AMYLASE in the last 168 hours. No results for input(s): AMMONIA in the last 168 hours. Coagulation Profile: No results for input(s): INR, PROTIME in the last 168 hours. Unresulted Labs (From admission, onward)     Start     Ordered   09/07/24 0500  Creatinine, serum  (enoxaparin  (LOVENOX )    CrCl >/= 30 ml/min)  Weekly,   STAT     Comments: while on enoxaparin  therapy    08/31/24 1458   09/02/24 1023  Culture, Respiratory w Gram Stain  Once,   R        09/02/24 1023   09/02/24 0500  Basic metabolic panel with GFR  Daily,   R      09/01/24 1216   09/02/24 0500  CBC  Daily,   R      09/01/24 1216           Antimicrobials/Microbiology: Anti-infectives (From admission, onward)    Start     Dose/Rate Route Frequency Ordered Stop   08/31/24 1500  cefTRIAXone  (ROCEPHIN ) 1 g in sodium chloride  0.9 % 100 mL IVPB        1 g 200 mL/hr over 30 Minutes Intravenous Every 24 hours 08/31/24 1458 09/05/24 1459         Component Value Date/Time   SDES EXPECTORATED SPUTUM 09/02/2024 1023   SPECREQUEST NONE 09/02/2024 1023   CULT NO GROWTH 5 DAYS 02/27/2016 0714   CULT NO GROWTH 5 DAYS 02/27/2016 0714   REPTSTATUS 09/02/2024 FINAL 09/02/2024 1023     Procedures:    Mennie LAMY, MD [facility] 09/02/2024, 1:20 PM

## 2024-09-03 DIAGNOSIS — J441 Chronic obstructive pulmonary disease with (acute) exacerbation: Secondary | ICD-10-CM | POA: Diagnosis not present

## 2024-09-03 LAB — BASIC METABOLIC PANEL WITH GFR
Anion gap: 12 (ref 5–15)
BUN: 12 mg/dL (ref 6–20)
CO2: 28 mmol/L (ref 22–32)
Calcium: 8.5 mg/dL — ABNORMAL LOW (ref 8.9–10.3)
Chloride: 101 mmol/L (ref 98–111)
Creatinine, Ser: 0.64 mg/dL (ref 0.44–1.00)
GFR, Estimated: >60 mL/min (ref >60)
Glucose, Bld: 85 mg/dL (ref 70–99)
Potassium: 4.1 mmol/L (ref 3.5–5.1)
Sodium: 141 mmol/L (ref 135–145)

## 2024-09-03 LAB — CBC
HCT: 39.3 % (ref 36.0–46.0)
Hemoglobin: 12.6 g/dL (ref 12.0–15.0)
MCH: 27.9 pg (ref 26.0–34.0)
MCHC: 32.1 g/dL (ref 30.0–36.0)
MCV: 87.1 fL (ref 80.0–100.0)
Platelets: 264 K/uL (ref 150–400)
RBC: 4.51 MIL/uL (ref 3.87–5.11)
RDW: 13.5 % (ref 11.5–15.5)
WBC: 9.2 K/uL (ref 4.0–10.5)
nRBC: 0 % (ref 0.0–0.2)

## 2024-09-03 MED ORDER — IPRATROPIUM-ALBUTEROL 0.5-2.5 (3) MG/3ML IN SOLN
3.0000 mL | Freq: Three times a day (TID) | RESPIRATORY_TRACT | Status: DC
Start: 2024-09-04 — End: 2024-09-05
  Administered 2024-09-04 – 2024-09-05 (×4): 3 mL via RESPIRATORY_TRACT
  Filled 2024-09-03 (×4): qty 3

## 2024-09-03 MED ORDER — IPRATROPIUM-ALBUTEROL 0.5-2.5 (3) MG/3ML IN SOLN
3.0000 mL | Freq: Four times a day (QID) | RESPIRATORY_TRACT | Status: DC
  Administered 2024-09-03: 3 mL via RESPIRATORY_TRACT
  Filled 2024-09-03: qty 3

## 2024-09-03 MED ORDER — DM-GUAIFENESIN ER 30-600 MG PO TB12
1.0000 | ORAL_TABLET | Freq: Two times a day (BID) | ORAL | Status: DC
  Administered 2024-09-03 – 2024-09-05 (×5): 1 via ORAL
  Filled 2024-09-03 (×5): qty 1

## 2024-09-03 MED ORDER — HYDROCOD POLI-CHLORPHE POLI ER 10-8 MG/5ML PO SUER
5.0000 mL | Freq: Two times a day (BID) | ORAL | Status: DC | PRN
  Administered 2024-09-03 – 2024-09-05 (×4): 5 mL via ORAL
  Filled 2024-09-03 (×4): qty 5

## 2024-09-03 NOTE — Plan of Care (Signed)
  Problem: Health Behavior/Discharge Planning: Goal: Ability to manage health-related needs will improve Outcome: Progressing   Problem: Clinical Measurements: Goal: Will remain free from infection Outcome: Progressing   Problem: Activity: Goal: Risk for activity intolerance will decrease Outcome: Progressing   Problem: Coping: Goal: Level of anxiety will decrease Outcome: Progressing   

## 2024-09-03 NOTE — Progress Notes (Signed)
   09/03/24 0900  Oxygen Therapy  SpO2 96 %  O2 Device Room Air  Mobility  Activity Ambulated with assistance  Level of Assistance Independent after set-up  Assistive Device None  Distance Ambulated (ft) 140 ft  Range of Motion/Exercises Active Assistive  Activity Response Tolerated well  Mobility visit 1 Mobility  Mobility Specialist Start Time (ACUTE ONLY) [id number]  Mobility Specialist Stop Time (ACUTE ONLY) 0929  Mobility Specialist Time Calculation (min) (ACUTE ONLY) 7 min   Mobility Specialist - Progress Note Pt was supine in bed upon entry. Pt agreed to mobility. Pt was able to EOB independently. Pt was able to STS independently. Pt ambulated well at a pace that was adequate. Pt O2 levels maintained above 91%. Pt increased mobility with new pace. After activity pt returned to bed with needs in reach.  Clem Rodes Mobility Specialist 09/03/24, 9:53 AM

## 2024-09-03 NOTE — Progress Notes (Signed)
 Progress Note   Patient: Denise Valentine FMW:[id number] DOB: 1971/06/02 DOA: 08/31/2024     3 DOS: the patient was seen and examined on 09/03/2024   Brief hospital course:  [place] Kestner is a 53 y.o. female with PMH of COPD, bipolar disorder, ADHD, opiate use disorder, gastroesophageal reflux disease who presents with 1 week of worsening shortness of breath as well as increase in sputum production as well as purulence not improved with home bronchodilators  In ED-vital stable on room air.  Labs reviewed pCO2 64 with pH 7.3 Twelve-lead ECG nsr- HR 77 no ST changes there is T wave inversion similar inferior leads which was also seen on 5/23 ECG more flat at that time. Her chest x-ray and along with CT angio chest> no PE, multiple nodules bilaterally stable from 717 Patient was admitted for COPD exacerbation  Further hospital course and management as outlined below.    Assessment and Plan:  COPD with acute exacerbation Acute hypoxic respiratory failure due to COPD exacerbation.   Now weaned off oxygen and stable on room air. Pulmonary nodules-stable Slightly positive D-dimer: CT angio ruled out PE.  +Sick contacts at home --Steroids escalated from PO to IV yesterday -- continue IV Solu-medrol  40 BID --Continue antibiotics  --Bronchodilators -- resume scheduled Duoneb  --Anoro ellipta  --Add scheduled Mucinex  BID --Tussionex BID PRN 9/17 -- severe persistent wheezing diffusely and frequent coughing fits, persistent DOE needing further clinical improvement    ECG abnormal T wave changes: On admission more inverted than flat from previous comparison, serial troponins have been negative, BNP normal Echo was obtained that showed EF 60 to 65% no RWMA RV SF normal mitral valve aortic valve normal Patient without chest pain follow-up outpatient   Bipolar 1 disorder ADHD: Mood stable, on risperidone  , trazodone  , bupropion  ,Ritalin  .   Nicotine  abuse Cont Nicotine  patch   Opiate  dependenc: Patient reports she is on methadone  110 mg daily through [facility] recovery solutions [id number]> CONT SAME      Subjective: Pt seen awake sitting up in bed this AM.  She reports frequent use of flutter valve but not able to produce much phlegm most of the time.  Frequent coughing fits, trouble sleeping due to cough.  Feels 50% better than she did no admission but still far from her baseline.  Physical Exam: Vitals:   09/03/24 0817 09/03/24 0900 09/03/24 1144 09/03/24 1207  BP: (!) 141/83  132/62   Pulse: 70  88   Resp: 18  18   Temp: 97.9 F (36.6 C)  98.1 F (36.7 C)   TempSrc: Oral  Oral   SpO2: 97% 96% 93% 93%  Weight:      Height:       General exam: awake, alert, no acute distress HEENT: moist mucus membranes, hearing grossly normal  Respiratory system: diffuse expiratory wheezes, increased respiratory effort at rest with some conversational dyspnea, frequent hacking coughing spells. Cardiovascular system: normal S1/S2, RRR, no pedal edema.   Gastrointestinal system: soft, NT, ND,  +bowel sounds. Central nervous system: A&O x3. no gross focal neurologic deficits, normal speech Extremities: moves all, no edema, normal tone Skin: dry, intact, normal temperature Psychiatry: normal mood, congruent affect, judgement and insight appear normal   Data Reviewed:  Notable labs -- Ca 8.5 otherwise normal BMP. CBC normal.  Family Communication: None present. Pt updated in detail and is capable to update family.  Disposition: Status is: Inpatient Remains inpatient appropriate because: remains on IV steroids pending further clinical improvement  Planned Discharge Destination: Home    Time spent: 42 minutes  Author: Burnard DELENA Cunning, DO 09/03/2024 2:08 PM  For on call review [email or web address].

## 2024-09-03 NOTE — Progress Notes (Signed)
   09/03/24 1400  Mobility  Activity Ambulated with assistance;Stood at bedside  Level of Assistance Standby assist, set-up cues, supervision of patient - no hands on  Assistive Device None  Distance Ambulated (ft) 220 ft  Range of Motion/Exercises Active Assistive  Activity Response Tolerated well  Mobility visit 1 Mobility  Mobility Specialist Start Time (ACUTE ONLY) 1421  Mobility Specialist Stop Time (ACUTE ONLY) 1431  Mobility Specialist Time Calculation (min) (ACUTE ONLY) 10 min   Mobility Specialist - Progress Note  Pt was in supine position in bed upon arrival. Pt agreed to mobility. Pt ambulated well with only one recovery break. O2 remained above 90% throughout activity. After activity pt returned to the bed with needs in reach.  Clem Rodes Mobility Specialist 09/03/24, 2:39 PM

## 2024-09-04 DIAGNOSIS — J441 Chronic obstructive pulmonary disease with (acute) exacerbation: Secondary | ICD-10-CM | POA: Diagnosis not present

## 2024-09-04 LAB — CULTURE, RESPIRATORY W GRAM STAIN: Culture: NORMAL

## 2024-09-04 NOTE — Progress Notes (Signed)
 Progress Note   Patient: Denise Valentine FMW:[id number] DOB: 30-Jan-1971 DOA: 08/31/2024     4 DOS: the patient was seen and examined on 09/04/2024   Brief hospital course:  Reet Scharrer is a 53 y.o. female with PMH of COPD, bipolar disorder, ADHD, opiate use disorder, gastroesophageal reflux disease who presents with 1 week of worsening shortness of breath as well as increase in sputum production as well as purulence not improved with home bronchodilators  In ED-vital stable on room air.  Labs reviewed pCO2 64 with pH 7.3 Twelve-lead ECG nsr- HR 77 no ST changes there is T wave inversion similar inferior leads which was also seen on 5/23 ECG more flat at that time. Her chest x-ray and along with CT angio chest> no PE, multiple nodules bilaterally stable from 717 Patient was admitted for COPD exacerbation  Further hospital course and management as outlined below.    Assessment and Plan:  COPD with acute exacerbation Acute hypoxic respiratory failure due to COPD exacerbation.   Now weaned off oxygen and stable on room air. Pulmonary nodules-stable Slightly positive D-dimer: CT angio ruled out PE.  +Sick contacts at home 9/17 -- severe persistent wheezing diffusely and frequent coughing fits, persistent DOE needing further clinical improvement 9/18 -- wheezing is improved but persistent, coughing spells with activity --Steroids escalated from PO to IV on 9/16  --Continue IV Solu-medrol  40 BID --May transition to prednisone  tomorrow if further improvement in next 24 hours --Continue Rocephin   --Bronchodilators -- resume scheduled Duoneb  --Anoro ellipta  --Scheduled Mucinex  BID --Tussionex BID PRN    ECG abnormal T wave changes: On admission more inverted than flat from previous comparison, serial troponins have been negative, BNP normal Echo was obtained that showed EF 60 to 65% no RWMA RV SF normal mitral valve aortic valve normal Patient without chest pain follow-up outpatient    Bipolar 1 disorder ADHD: Mood stable, on risperidone  , trazodone  , bupropion  ,Ritalin  .   Nicotine  abuse Cont Nicotine  patch   Opiate dependenc: Patient reports she is on methadone  110 mg daily through [facility] recovery solutions [id number]> CONT SAME      Subjective: Pt seen awake sitting up in bed this AM.  She reports feeling better today, less wheezing. Cough improved with Tussionex, but gets worse with exertion.  Coughing fits today brought on when ambulating reported by RN.     Physical Exam: Vitals:   09/04/24 0724 09/04/24 0842 09/04/24 1100 09/04/24 1217  BP:  126/69  121/78  Pulse:  75  80  Resp:  (!) 22  20  Temp:  98.5 F (36.9 C)  98.5 F (36.9 C)  TempSrc:      SpO2: 95% 93% 92% 91%  Weight:      Height:       General exam: awake, alert, no acute distress HEENT: moist mucus membranes, hearing grossly normal  Respiratory system: improved expiratory wheezes more faint today, conversational dyspnea is improved only mild today, aeration is improved, no rhonchi. Cardiovascular system: normal S1/S2, RRR, no pedal edema.   Central nervous system: A&O x3. no gross focal neurologic deficits, normal speech Extremities: moves all, no edema, normal tone Psychiatry: normal mood, congruent affect, judgement and insight appear normal    Data Reviewed: No new labs today    Family Communication: None present. Pt updated in detail and is capable to update family.  Disposition: Status is: Inpatient Remains inpatient appropriate because: remains on IV steroids pending further clinical improvement    Planned Discharge  Destination: Home    Time spent: 38 minutes  Author: Burnard DELENA Cunning, DO 09/04/2024 12:53 PM  For on call review [email or web address].

## 2024-09-04 NOTE — Progress Notes (Signed)
   09/04/24 1400  Mobility  Activity Ambulated with assistance  Level of Assistance Independent after set-up  Assistive Device None  Distance Ambulated (ft) 160 ft  Range of Motion/Exercises Active  Activity Response Tolerated well  Mobility visit 1 Mobility  Mobility Specialist Start Time (ACUTE ONLY) 1403  Mobility Specialist Stop Time (ACUTE ONLY) 1410  Mobility Specialist Time Calculation (min) (ACUTE ONLY) 7 min   Mobility Specialist - Progress Note Pt was supine in the bed upon entry. Pt agreed to mobility. Pt was able to to complete prior functions as stated earlier in the day. Pt O2 levels maintained above 92% throughout activity. After activity pt returned to the bed with with needs in reach.  Clem Rodes Mobility Specialist 09/04/24, 2:16 PM

## 2024-09-04 NOTE — Plan of Care (Signed)
  Problem: Health Behavior/Discharge Planning: Goal: Ability to manage health-related needs will improve Outcome: Progressing   Problem: Clinical Measurements: Goal: Diagnostic test results will improve Outcome: Progressing Goal: Cardiovascular complication will be avoided Outcome: Progressing   Problem: Coping: Goal: Level of anxiety will decrease Outcome: Progressing

## 2024-09-04 NOTE — Progress Notes (Signed)
   09/04/24 1100  Oxygen Therapy  SpO2 92 %  O2 Device Room Air  Mobility  Activity Ambulated with assistance  Level of Assistance Standby assist, set-up cues, supervision of patient - no hands on  Assistive Device None  Distance Ambulated (ft) 210 ft  Range of Motion/Exercises Active Assistive;All extremities  Activity Response Tolerated well  Mobility Referral Yes  Mobility visit 1 Mobility  Mobility Specialist Start Time (ACUTE ONLY) 1056  Mobility Specialist Stop Time (ACUTE ONLY) 1108  Mobility Specialist Time Calculation (min) (ACUTE ONLY) 12 min   Mobility Specialist - Progress Note Pt was supine in bed upon entry. Pt agreed to mobility. Pt was able to EOB independently. Pt was also able to STS independently. Pt O2 levels upon STS 90%. Pt ambulated well. Pt needed two recovery breaks during activity. O2 levels during both recovery breaks were at 88%. After activity pt was able to recover quickly and return back to the bed.  Clem Rodes Mobility Specialist 09/04/24, 11:20 AM

## 2024-09-04 NOTE — Progress Notes (Signed)
 Nutrition Follow-up  DOCUMENTATION CODES:   Not applicable  INTERVENTION:   -Continue regular diet -Continue MVI with minerals daily -Continue Magic cup BID with meals, each supplement provides 290 kcal and 9 grams of protein   NUTRITION DIAGNOSIS:   Increased nutrient needs related to chronic illness (COPD) as evidenced by estimated needs.  Ongoing  GOAL:   Patient will meet greater than or equal to 90% of their needs  Progressing   MONITOR:   PO intake, Supplement acceptance  REASON FOR ASSESSMENT:   Consult Assessment of nutrition requirement/status  ASSESSMENT:   Pt with a past medical history of COPD, bipolar disorder, ADHD, opiate use disorder, gastroesophageal reflux disease who presents with 1 week of worsening shortness of breath as well as increase in sputum production as well as purulence PTA.  The patient reports she has had a less response to her home bronchodilators no chest pain fever or chills but now she is dyspneic with minimal activity.  Reviewed I/O's: +1.7 L x 24 hours and +4.7 L since admission  Pt sleeping soundly at time of visit. She did not arouse to name being called.   Pt remains a regular diet. Noted meal completions 40-100%.   No new wt since admission.   Plan to discharge home when medically stable.   Medications reviewed and include lovenox , solu-medrol , MVI, and protonix .  Labs reviewed.   Diet Order:   Diet Order             Diet regular Room service appropriate? Yes; Fluid consistency: Thin  Diet effective now                   EDUCATION NEEDS:   No education needs have been identified at this time  Skin:  Skin Assessment: Reviewed RN Assessment  Last BM:  Unknown  Height:   Ht Readings from Last 1 Encounters:  08/31/24 5' 7 (1.702 m)    Weight:   Wt Readings from Last 1 Encounters:  08/31/24 81.6 kg    Ideal Body Weight:  61.4 kg  BMI:  Body mass index is 28.19 kg/m.  Estimated Nutritional  Needs:   Kcal:  1850-2050  Protein:  90-105 grams  Fluid:  1.8-2.0 L    Margery ORN, RD, LDN, CDCES Registered Dietitian III Certified Diabetes Care and Education Specialist If unable to reach this RD, please use RD Inpatient group chat on secure chat between hours of 8am-4 pm daily

## 2024-09-05 DIAGNOSIS — J441 Chronic obstructive pulmonary disease with (acute) exacerbation: Secondary | ICD-10-CM | POA: Diagnosis not present

## 2024-09-05 MED ORDER — NICOTINE 14 MG/24HR TD PT24: 14.0000 mg | MEDICATED_PATCH | Freq: Every day | TRANSDERMAL | 0 refills | Status: AC

## 2024-09-05 MED ORDER — PREDNISONE 20 MG PO TABS: 40.0000 mg | ORAL_TABLET | Freq: Every day | ORAL | Status: DC

## 2024-09-05 MED ORDER — HYDROCOD POLI-CHLORPHE POLI ER 10-8 MG/5ML PO SUER: 5.0000 mL | Freq: Two times a day (BID) | ORAL | 0 refills | Status: AC | PRN

## 2024-09-05 MED ORDER — METHYLPHENIDATE HCL 20 MG PO TABS: 20.0000 mg | ORAL_TABLET | Freq: Two times a day (BID) | ORAL | 0 refills | Status: AC

## 2024-09-05 MED ORDER — PREDNISONE 20 MG PO TABS: ORAL_TABLET | ORAL | 0 refills | Status: AC

## 2024-09-05 MED ORDER — GUAIFENESIN-DM 100-10 MG/5ML PO SYRP: 10.0000 mL | ORAL_SOLUTION | ORAL | Status: AC | PRN

## 2024-09-05 MED ORDER — IPRATROPIUM-ALBUTEROL 0.5-2.5 (3) MG/3ML IN SOLN: 3.0000 mL | Freq: Two times a day (BID) | RESPIRATORY_TRACT | Status: DC

## 2024-09-05 MED ORDER — DM-GUAIFENESIN ER 30-600 MG PO TB12: 1.0000 | ORAL_TABLET | Freq: Two times a day (BID) | ORAL | 0 refills | Status: AC | PRN

## 2024-09-05 NOTE — Progress Notes (Signed)
   09/05/24 0900  Oxygen Therapy  SpO2 93 %  O2 Device Room Air  Mobility  Activity Ambulated independently;Stood at bedside  Level of Assistance Standby assist, set-up cues, supervision of patient - no hands on  Assistive Device None  Distance Ambulated (ft) 165 ft  Range of Motion/Exercises All extremities  Activity Response Tolerated well  Mobility visit 1 Mobility  Mobility Specialist Start Time (ACUTE ONLY) [id number]  Mobility Specialist Stop Time (ACUTE ONLY) 0926  Mobility Specialist Time Calculation (min) (ACUTE ONLY) 8 min   Mobility Specialist - Progress Note Pt was supine in bed upon entry. Pt agreed to mobility. Pt O2 levels at EOB independently at 92%. Pt was able to STS independently. Pt was able to ambulate with CGA. Pt was able to complete activity with one recovery for O2 check 91%. Pt after activity returned to the bed in a upright position. Pt needs are in reach.  Clem Rodes Mobility Specialist 09/05/24, 9:54 AM

## 2024-09-05 NOTE — Plan of Care (Signed)
  Problem: Clinical Measurements: Goal: Ability to maintain clinical measurements within normal limits will improve Outcome: Progressing Goal: Respiratory complications will improve Outcome: Progressing   Problem: Nutrition: Goal: Adequate nutrition will be maintained Outcome: Progressing   Problem: Elimination: Goal: Will not experience complications related to urinary retention Outcome: Progressing

## 2024-09-05 NOTE — Discharge Summary (Addendum)
 Physician Discharge Summary   Patient: Denise Valentine MRN: [id number] DOB: 1971-01-07  Admit date:     08/31/2024  Discharge date: 09/05/2024  Discharge Physician: Denise Valentine   PCP: Denise Idelia DELENA, MD   Recommendations at discharge:    Follow up with Primary Care in 1-2 weeks Follow up with Psychiatry as scheduled Follow up / reschedule appt for Ritalin  refill ASAP  Discharge Diagnoses: Principal Problem:   COPD exacerbation (HCC) Active Problems:   Acute hypoxic respiratory failure (HCC)   Abnormal CXR   Opiate dependence (HCC)   Nicotine  abuse   ADHD   Bipolar 1 disorder (HCC)   Positive D dimer   ECG abnormal  Resolved Problems:   * No resolved hospital problems. [facility] Course: Denise Valentine is Valentine 53 y.o. female with PMH of COPD, bipolar disorder, ADHD, opiate use disorder, gastroesophageal reflux disease who presents with 1 week of worsening shortness of breath as well as increase in sputum production as well as purulence not improved with home bronchodilators  In ED-vital stable on room air.  Labs reviewed pCO2 64 with pH 7.3 Twelve-lead ECG nsr- HR 77 no ST changes there is T wave inversion similar inferior leads which was also seen on 5/23 ECG more flat at that time. Her chest x-ray and along with CT angio chest> no PE, multiple nodules bilaterally stable from 717 Patient was admitted for COPD exacerbation   Further hospital course and management as outlined below.  9/19 -- pt doing better this AM, reports now able to ambulate with better tolerance, less dyspnea.  Pt is stable on room air, medically stable and requesting discharge home today.  Assessment and Plan:  COPD with acute exacerbation Acute hypoxic respiratory failure ruled out - O2 sats remained stable on room air. Pulmonary nodules-stable Slightly positive D-dimer: CT angio ruled out PE.  +Sick contacts at home 9/17 -- severe persistent wheezing diffusely and frequent coughing fits,  persistent DOE needing further clinical improvement 9/18 -- wheezing is improved but persistent, coughing spells with activity --Steroids were escalated from PO to IV Solu-medrol  40 BID on 9/16  --Transition back to prednisone  & d/c on taper --Treated with IV Rocephin   - completed antibiotics --Bronchodilators -- resumed scheduled Duoneb  --Anoro ellipta  --Scheduled Mucinex  BID --Tussionex BID PRN     ECG abnormal T wave changes: On admission more inverted than flat from previous comparison, serial troponins have been negative, BNP normal Echo was obtained that showed EF 60 to 65% no RWMA RV SF normal mitral valve aortic valve normal Patient without chest pain PCP follow-up outpatient   Bipolar 1 disorder  - stable mood ADHD: --on risperidone  , trazodone  , bupropion  ,Ritalin  --Short course of Ritalin  prescribed as pt missed her routine scheduled appt for refill due to being admitted.  7 days Rx sent until she can follow up.   Nicotine  abuse Cont Nicotine  patch   Opiate dependenc: Patient reports she is on methadone  110 mg daily through [facility] [id number]> CONT SAME       Consultants: None Procedures performed: None Disposition: Home Diet recommendation:  Regular diet DISCHARGE MEDICATION: Allergies as of 09/05/2024       Reactions   Toradol  [ketorolac  Tromethamine ] Shortness Of Breath, Rash   Tramadol Hives   Carbamazepine Rash   Erythromycin Rash   Fish Allergy Other (See Comments)   Headache        Medication List     STOP taking these medications  famotidine  20 MG tablet Commonly known as: PEPCID    methylPREDNISolone  4 MG Tbpk tablet Commonly known as: MEDROL  DOSEPAK   naproxen  500 MG tablet Commonly known as: Naprosyn    ondansetron  4 MG disintegrating tablet Commonly known as: Zofran  ODT       TAKE these medications    albuterol  (2.5 MG/3ML) 0.083% nebulizer solution Commonly known as: PROVENTIL  Inhale 3 mLs into the  lungs every 6 (six) hours as needed (Copd/asthma).   budesonide -formoterol  160-4.5 MCG/ACT inhaler Commonly known as: SYMBICORT  Inhale 2 puffs into the lungs 2 (two) times daily.   buPROPion  150 MG 24 hr tablet Commonly known as: WELLBUTRIN  XL Take 150 mg by mouth daily.   chlorpheniramine-HYDROcodone  10-8 MG/5ML Commonly known as: TUSSIONEX Take 5 mLs by mouth every 12 (twelve) hours as needed (persistent or nighttime cough).   dextromethorphan -guaiFENesin  30-600 MG 12hr tablet Commonly known as: MUCINEX  DM Take 1 tablet by mouth 2 (two) times daily as needed for cough.   guaiFENesin -dextromethorphan  100-10 MG/5ML syrup Commonly known as: ROBITUSSIN DM Take 10 mLs by mouth every 4 (four) hours as needed for cough.   ibuprofen  800 MG tablet Commonly known as: ADVIL  Take 800 mg by mouth every 8 (eight) hours as needed for mild pain.   METHADONE  HCL PO Take 110 mg by mouth daily. 110mg  liquid;; [facility] [id number]   methylphenidate  20 MG tablet Commonly known as: RITALIN  Take 1 tablet (20 mg total) by mouth 2 (two) times daily for 7 days.   nicotine  14 mg/24hr patch Commonly known as: NICODERM CQ  - dosed in mg/24 hours Place 1 patch (14 mg total) onto the skin daily.   pantoprazole  40 MG tablet Commonly known as: PROTONIX  Take 40 mg by mouth daily.   risperiDONE  1 MG tablet Commonly known as: RISPERDAL  Take 1 mg by mouth at bedtime.   Tiotropium Bromide  Monohydrate 2.5 MCG/ACT Aers Inhale 5 mcg into the lungs daily.   traZODone  50 MG tablet Commonly known as: DESYREL  Take 50 mg by mouth at bedtime.       ASK your doctor about these medications    predniSONE  20 MG tablet Commonly known as: DELTASONE  Take 2 tablets (40 mg total) by mouth daily with breakfast for 1 day, THEN 1.5 tablets (30 mg total) daily with breakfast for 1 day, THEN 1 tablet (20 mg total) daily with breakfast for 1 day, THEN 0.5 tablets (10 mg total) daily with  breakfast for 1 day. Start taking on: September 06, 2024 Ask about: Should I take this medication?        Follow-up Information     George, Sionne A, MD. Go on 09/10/2024.   Specialty: Family Medicine Why: @ 11:45am Contact information: [address] [place] [address] [phone]                Discharge Exam: Filed Weights   08/31/24 1051  Weight: 81.6 kg   General exam: awake, alert, no acute distress HEENT: amoist mucus membranes, hearing grossly normal  Respiratory system: CTAB with improved aeration, no wheezes  or rhonchi, normal respiratory effort at rest. Cardiovascular system: normal S1/S2, RRR, no JVD, murmurs, rubs, gallops, no pedal edema.   Gastrointestinal system: soft, NT, ND, no HSM felt, +bowel sounds. Central nervous system: Valentine&O x3. no gross focal neurologic deficits, normal speech Extremities: moves all, no edema, normal tone Skin: dry, intact, normal temperature, normal color, No rashes, lesions or ulcers Psychiatry: normal mood, congruent affect, judgement and insight appear normal  Condition at discharge: stable  The results of significant diagnostics from this hospitalization (including imaging, microbiology, ancillary and laboratory) are listed below for reference.   Imaging Studies: ECHOCARDIOGRAM COMPLETE Result Date: 09/01/2024    ECHOCARDIOGRAM REPORT   Patient Name:   CARAGH GASPER Date of Exam: 09/01/2024 Medical Rec #:  [id number]    Height:       67.0 in Accession #:    [id number]   Weight:       180.0 lb Date of Birth:  January 26, 1971     BSA:          1.934 m Patient Age:    53 years     BP:           121/66 mmHg Patient Gender: F            HR:           80 bpm. Exam Location:  [facility] Procedure: 2D Echo, Cardiac Doppler and Color Doppler (Both Spectral and Color            Flow Doppler were utilized during procedure). Indications:     Abnormal ECG  History:         Patient has no prior history of Echocardiogram examinations.                   COPD; Risk Factors:Current Smoker.  Sonographer:     Philomena Daring Referring Phys:  [id number] RAMESH KC Diagnosing Phys: Deatrice Cage MD  Sonographer Comments: Technically difficult study due to poor echo windows, no parasternal window and Technically challenging study due to limited acoustic windows. IMPRESSIONS  1. Left ventricular ejection fraction, by estimation, is 60 to 65%. The left ventricle has normal function. The left ventricle has no regional wall motion abnormalities. Left ventricular diastolic parameters were normal.  2. Right ventricular systolic function is normal. The right ventricular size is normal. Tricuspid regurgitation signal is inadequate for assessing PA pressure.  3. The mitral valve is normal in structure. No evidence of mitral valve regurgitation. No evidence of mitral stenosis.  4. The aortic valve is normal in structure. Aortic valve regurgitation is not visualized. No aortic stenosis is present.  5. The inferior vena cava is normal in size with greater than 50% respiratory variability, suggesting right atrial pressure of 3 mmHg. FINDINGS  Left Ventricle: Left ventricular ejection fraction, by estimation, is 60 to 65%. The left ventricle has normal function. The left ventricle has no regional wall motion abnormalities. The left ventricular internal cavity size was normal in size. There is  no left ventricular hypertrophy. Left ventricular diastolic parameters were normal. Right Ventricle: The right ventricular size is normal. No increase in right ventricular wall thickness. Right ventricular systolic function is normal. Tricuspid regurgitation signal is inadequate for assessing PA pressure. Left Atrium: Left atrial size was normal in size. Right Atrium: Right atrial size was normal in size. Pericardium: There is no evidence of pericardial effusion. Mitral Valve: The mitral valve is normal in structure. No evidence of mitral valve regurgitation. No evidence of mitral valve stenosis.  Tricuspid Valve: The tricuspid valve is normal in structure. Tricuspid valve regurgitation is not demonstrated. No evidence of tricuspid stenosis. Aortic Valve: The aortic valve is normal in structure. Aortic valve regurgitation is not visualized. No aortic stenosis is present. Pulmonic Valve: The pulmonic valve was normal in structure. Pulmonic valve regurgitation is not visualized. No evidence of pulmonic stenosis. Aorta: The aortic root is normal in size and structure. Venous: The  inferior vena cava is normal in size with greater than 50% respiratory variability, suggesting right atrial pressure of 3 mmHg. IAS/Shunts: No atrial level shunt detected by color flow Doppler.   Diastology LV e' medial:    12.40 cm/s LV E/e' medial:  8.0 LV e' lateral:   13.20 cm/s LV E/e' lateral: 7.5  RIGHT VENTRICLE             IVC RV S prime:     12.30 cm/s  IVC diam: 1.63 cm TAPSE (M-mode): 1.7 cm LEFT ATRIUM             Index        RIGHT ATRIUM           Index LA Vol (A2C):   45.7 ml 23.63 ml/m  RA Area:     14.60 cm LA Vol (A4C):   40.1 ml 20.74 ml/m  RA Volume:   39.50 ml  20.43 ml/m LA Biplane Vol: 43.0 ml 22.24 ml/m  AORTIC VALVE LVOT Vmax:   109.00 cm/s LVOT Vmean:  74.200 cm/s LVOT VTI:    0.213 m MITRAL VALVE MV Area (PHT): 4.36 cm     SHUNTS MV Decel Time: 174 msec     Systemic VTI: 0.21 m MV E velocity: 99.00 cm/s MV Valentine velocity: 109.00 cm/s MV E/Valentine ratio:  0.91 Deatrice Cage MD Electronically signed by Deatrice Cage MD Signature Date/Time: 09/01/2024/2:01:36 PM    Final    CT Angio Chest Pulmonary Embolism (PE) W or WO Contrast Result Date: 08/31/2024 CLINICAL DATA:  Shortness of breath for 1 week EXAM: CT ANGIOGRAPHY CHEST WITH CONTRAST TECHNIQUE: Multidetector CT imaging of the chest was performed using the standard protocol during bolus administration of intravenous contrast. Multiplanar CT image reconstructions and MIPs were obtained to evaluate the vascular anatomy. RADIATION DOSE REDUCTION: This exam was  performed according to the departmental dose-optimization program which includes automated exposure control, adjustment of the mA and/or kV according to patient size and/or use of iterative reconstruction technique. CONTRAST:  75mL OMNIPAQUE  IOHEXOL  350 MG/ML SOLN COMPARISON:  Chest x-ray from earlier in the same day, 04/24/2016 FINDINGS: Cardiovascular: Thoracic aorta shows no aneurysmal dilatation or dissection. The heart is not significantly enlarged in size. The pulmonary artery shows Valentine normal branching pattern bilaterally. No definitive intraluminal filling defect to suggest pulmonary embolism is noted. Mild coronary calcifications are noted. Mediastinum/Nodes: Thoracic inlet is within normal limits. No hilar or mediastinal adenopathy is noted. The esophagus as visualized is within normal limits. Lungs/Pleura: Mild scarring is noted along the inferior aspect of the major fissure on the right. No focal infiltrate or sizable effusion is seen. Stable 8 mm nodule is noted in the right lower lobe best seen on image number 69 of series 6. This is unchanged from Valentine prior CT from 2017 and consistent with Valentine benign etiology. No further follow-up is recommended. Nodule is noted within the medial aspect of the right upper lobe best seen on image number 29 of series 6 also stable from 2017 consistent with Valentine benign etiology. Peri fissural nodule is noted along the fissure on the left best seen on image number 55 of series 6 also stable from the prior exam and benign in etiology. Mild emphysematous changes are seen. Pleuroparenchymal scarring is noted. Upper Abdomen: Visualized upper abdomen shows changes of prior cholecystectomy. Musculoskeletal: Degenerative changes of the thoracic spine are noted. No acute rib abnormality is noted. Review of the MIP images confirms the above findings. IMPRESSION: No evidence  of pulmonary emboli. Multiple nodules bilaterally stable from 2017 consistent with benign etiology. No further  follow-up is recommended. No other focal abnormality is noted. Electronically Signed   By: Oneil Devonshire M.D.   On: 08/31/2024 20:07   DG Chest 2 View Result Date: 08/31/2024 EXAM: 2 VIEW(S) XRAY OF THE CHEST 08/31/2024 11:24:00 AM COMPARISON: 05/09/2024 CLINICAL HISTORY: Pt to ED for shobx1 week. Reports has been sick with worsening shob. Hx COPD, asthma, emphysema. Wheezing noted. FINDINGS: LUNGS AND PLEURA: Coarse chronic appearing perihilar interstitial opacities left greater than right, stable. No new infiltrate. No pulmonary edema. No pleural effusion. No pneumothorax. HEART AND MEDIASTINUM: No acute abnormality of the cardiac and mediastinal silhouettes. BONES AND SOFT TISSUES: No acute osseous abnormality. IMPRESSION: 1. No acute process. 2. Stable coarse chronic appearing perihilar interstitial opacities, left greater than right. Electronically signed by: Dayne Hassell MD 08/31/2024 11:36 AM EDT RP Workstation: [id number]    Microbiology: Results for orders placed or performed during the hospital encounter of 08/31/24  Resp panel by RT-PCR (RSV, Flu Valentine&B, Covid) Anterior Nasal Swab     Status: None   Collection Time: 08/31/24 11:52 AM   Specimen: Anterior Nasal Swab  Result Value Ref Range Status   SARS Coronavirus 2 by RT PCR NEGATIVE NEGATIVE Final    Comment: (NOTE) SARS-CoV-2 target nucleic acids are NOT DETECTED.  The SARS-CoV-2 RNA is generally detectable in upper respiratory specimens during the acute phase of infection. The lowest concentration of SARS-CoV-2 viral copies this assay can detect is 138 copies/mL. Valentine negative result does not preclude SARS-Cov-2 infection and should not be used as the sole basis for treatment or other patient management decisions. Valentine negative result may occur with  improper specimen collection/handling, submission of specimen other than nasopharyngeal swab, presence of viral mutation(s) within the areas targeted by this assay, and inadequate number of  viral copies(<138 copies/mL). Valentine negative result must be combined with clinical observations, patient history, and epidemiological information. The expected result is Negative.  Fact Sheet for Patients:  BloggerCourse.com  Fact Sheet for Healthcare Providers:  SeriousBroker.it  This test is no t yet approved or cleared by the [place]  FDA and  has been authorized for detection and/or diagnosis of SARS-CoV-2 by FDA under an Emergency Use Authorization (EUA). This EUA will remain  in effect (meaning this test can be used) for the duration of the COVID-19 declaration under Section 564(b)(1) of the Act, 21 U.S.C.section 360bbb-3(b)(1), unless the authorization is terminated  or revoked sooner.       Influenza Valentine by PCR NEGATIVE NEGATIVE Final   Influenza B by PCR NEGATIVE NEGATIVE Final    Comment: (NOTE) The Xpert Xpress SARS-CoV-2/FLU/RSV plus assay is intended as an aid in the diagnosis of influenza from Nasopharyngeal swab specimens and should not be used as Valentine sole basis for treatment. Nasal washings and aspirates are unacceptable for Xpert Xpress SARS-CoV-2/FLU/RSV testing.  Fact Sheet for Patients: BloggerCourse.com  Fact Sheet for Healthcare Providers: SeriousBroker.it  This test is not yet approved or cleared by the [place]  FDA and has been authorized for detection and/or diagnosis of SARS-CoV-2 by FDA under an Emergency Use Authorization (EUA). This EUA will remain in effect (meaning this test can be used) for the duration of the COVID-19 declaration under Section 564(b)(1) of the Act, 21 U.S.C. section 360bbb-3(b)(1), unless the authorization is terminated or revoked.     Resp Syncytial Virus by PCR NEGATIVE NEGATIVE Final    Comment: (NOTE) Fact Sheet for  Patients: BloggerCourse.com  Fact Sheet for Healthcare  Providers: SeriousBroker.it  This test is not yet approved or cleared by the [place]  FDA and has been authorized for detection and/or diagnosis of SARS-CoV-2 by FDA under an Emergency Use Authorization (EUA). This EUA will remain in effect (meaning this test can be used) for the duration of the COVID-19 declaration under Section 564(b)(1) of the Act, 21 U.S.C. section 360bbb-3(b)(1), unless the authorization is terminated or revoked.  Performed at [facility], [address] [place], [place] [address]   Expectorated Sputum Assessment w Gram Stain, Rflx to Resp Cult     Status: None   Collection Time: 09/02/24 10:23 AM   Specimen: Expectorated Sputum  Result Value Ref Range Status   Specimen Description EXPECTORATED SPUTUM  Final   Special Requests NONE  Final   Sputum evaluation   Final    THIS SPECIMEN IS ACCEPTABLE FOR SPUTUM CULTURE Performed at [facility], [address], [place], [place] [address]    Report Status 09/02/2024 FINAL  Final  Culture, Respiratory w Gram Stain     Status: None   Collection Time: 09/02/24 10:23 AM  Result Value Ref Range Status   Specimen Description   Final    EXPECTORATED SPUTUM Performed at [facility], [address] [place], [place] [address]    Special Requests   Final    NONE Reflexed from [id number] Performed at [facility], [address] [place], [place] [address]    Gram Stain   Final    ABUNDANT SQUAMOUS EPITHELIAL CELLS PRESENT RARE GRAM POSITIVE COCCI IN PAIRS    Culture   Final    Normal respiratory flora-no Staph aureus or Pseudomonas seen Performed at [facility], [address] [place], [place] [address]    Report Status 09/04/2024 FINAL  Final    Labs: CBC: No results for input(s): WBC, NEUTROABS, HGB, HCT, MCV, PLT in the last 168 hours.  Basic Metabolic Panel: No results for input(s): NA, K, CL, CO2, GLUCOSE,  BUN, CREATININE, CALCIUM, MG, PHOS in the last 168 hours.  Liver Function Tests: No results for input(s): AST, ALT, ALKPHOS, BILITOT, PROT, ALBUMIN in the last 168 hours.  CBG: No results for input(s): GLUCAP in the last 168 hours.  Discharge time spent: less than 30 minutes.  Signed: Burnard Valentine Cunning, DO [facility] 09/17/2024
